# Patient Record
Sex: Male | Born: 1950 | Race: Black or African American | Hispanic: No | Marital: Married | Smoking: Never smoker
Health system: Southern US, Community
[De-identification: ages and names within clinical notes are randomized; demographics above are authoritative.]

## PROBLEM LIST (undated history)

## (undated) DIAGNOSIS — M2550 Pain in unspecified joint: Secondary | ICD-10-CM

## (undated) DIAGNOSIS — I10 Essential (primary) hypertension: Secondary | ICD-10-CM

## (undated) DIAGNOSIS — C61 Malignant neoplasm of prostate: Secondary | ICD-10-CM

## (undated) DIAGNOSIS — Z9889 Other specified postprocedural states: Secondary | ICD-10-CM

## (undated) DIAGNOSIS — K649 Unspecified hemorrhoids: Secondary | ICD-10-CM

## (undated) DIAGNOSIS — K219 Gastro-esophageal reflux disease without esophagitis: Secondary | ICD-10-CM

## (undated) DIAGNOSIS — D369 Benign neoplasm, unspecified site: Secondary | ICD-10-CM

## (undated) DIAGNOSIS — E7800 Pure hypercholesterolemia, unspecified: Secondary | ICD-10-CM

## (undated) DIAGNOSIS — K625 Hemorrhage of anus and rectum: Secondary | ICD-10-CM

## (undated) DIAGNOSIS — E079 Disorder of thyroid, unspecified: Secondary | ICD-10-CM

## (undated) DIAGNOSIS — R7989 Other specified abnormal findings of blood chemistry: Secondary | ICD-10-CM

## (undated) HISTORY — PX: COLONOSCOPY: SHX174

## (undated) HISTORY — DX: Malignant neoplasm of prostate: C61

## (undated) HISTORY — PX: PROSTATE BIOPSY: SHX241

## (undated) HISTORY — DX: Benign neoplasm, unspecified site: D36.9

---
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
## 1989-09-02 HISTORY — PX: VASECTOMY: SHX75

## 2003-09-29 ENCOUNTER — Ambulatory Visit (HOSPITAL_COMMUNITY): Admission: RE | Admit: 2003-09-29 | Discharge: 2003-09-29 | Payer: Self-pay | Admitting: General Surgery

## 2012-10-21 NOTE — H&P (Signed)
  NTS SOAP Note  Vital Signs:  Vitals as of: 10/20/2012: Systolic 135: Diastolic 90: Heart Rate 73: Temp 98.26F: Height 61ft 8in: Weight 189Lbs 0 Ounces: BMI 29  BMI : 28.74 kg/m2  Subjective: This 62 Years 45 Months old Male presents for a colonoscopy.  Had bright red blood per rectum several weeks ago after a hard bowel movement.  No gi bleed since that time.  Had a TCS many years ago.     Review of Symptoms:  Constitutional:unremarkable      headache Eyes:unremarkable   Nose/Mouth/Throat:unremarkable Cardiovascular:  unremarkable   Respiratory:unremarkable   Gastrointestin    heartburn Genitourinary:unremarkable       joint pain Skin:unremarkable Hematolgic/Lymphatic:unremarkable     Allergic/Immunologic:unremarkable     Past Medical History:    Reviewed   Past Medical History  Surgical History: hernia repair Medical Problems:  High Blood pressure, High cholesterol Allergies: nkda Medications: amlodipine, atorvastatin, omeprazole   Social History:Reviewed  Social History  Preferred Language: English Race:  Black or African American Age: 62 Years 11 Months Marital Status:  S Alcohol:  No Recreational drug(s):  No   Smoking Status: Never smoker reviewed on 10/21/2012 Functional Status reviewed on mm/dd/yyyy ------------------------------------------------ Bathing: Normal Cooking: Normal Dressing: Normal Driving: Normal Eating: Normal Managing Meds: Normal Oral Care: Normal Shopping: Normal Toileting: Normal Transferring: Normal Walking: Normal Cognitive Status reviewed on mm/dd/yyyy ------------------------------------------------ Attention: Normal Decision Making: Normal Language: Normal Memory: Normal Motor: Normal Perception: Normal Problem Solving: Normal Visual and Spatial: Normal   Family History:  Reviewed   Family History  Is there a family history of:No family h/o colon cancer  Father:  Cancer    Objective Information: General:  Well appearing, well nourished in no distress. Neck:  Supple without lymphadenopathy.  Heart:  RRR, no murmur or gallop.  Normal S1, S2.  No S3, S4.  Lungs:    CTA bilaterally, no wheezes, rhonchi, rales.  Breathing unlabored. Abdomen:Soft, NT/ND, no HSM, no masses.   deferred to procedure  Assessment:hematochezia, need for screening TCS  Diagnosis &amp; Procedure:    Plan:Scheduled for TCS on 10/27/12.   Patient Education:Alternative treatments to surgery were discussed with patient (and family).  Risks and benefits  of procedure were fully explained to the patient (and family) who gave informed consent. Patient/family questions were addressed.  Follow-up:Pending Surgery

## 2012-10-26 MED ORDER — SODIUM CHLORIDE 0.9 % IV SOLN: INTRAVENOUS | Status: DC

## 2012-10-27 ENCOUNTER — Encounter (HOSPITAL_COMMUNITY): Payer: Self-pay | Admitting: *Deleted

## 2012-10-27 ENCOUNTER — Encounter (HOSPITAL_COMMUNITY)
Admission: RE | Disposition: A | Discharge status: Home / Self Care | Payer: Self-pay | Source: Ambulatory Visit | Attending: General Surgery

## 2012-10-27 ENCOUNTER — Ambulatory Visit (HOSPITAL_COMMUNITY)
Admission: RE | Admit: 2012-10-27 | Discharge: 2012-10-27 | Disposition: A | Discharge status: Home / Self Care | Payer: 59 | Source: Ambulatory Visit | Attending: General Surgery | Admitting: General Surgery

## 2012-10-27 DIAGNOSIS — Z1211 Encounter for screening for malignant neoplasm of colon: Secondary | ICD-10-CM | POA: Insufficient documentation

## 2012-10-27 DIAGNOSIS — I10 Essential (primary) hypertension: Secondary | ICD-10-CM | POA: Insufficient documentation

## 2012-10-27 DIAGNOSIS — E7800 Pure hypercholesterolemia, unspecified: Secondary | ICD-10-CM | POA: Insufficient documentation

## 2012-10-27 DIAGNOSIS — K648 Other hemorrhoids: Secondary | ICD-10-CM | POA: Insufficient documentation

## 2012-10-27 HISTORY — DX: Hemorrhage of anus and rectum: K62.5

## 2012-10-27 HISTORY — DX: Pure hypercholesterolemia, unspecified: E78.00

## 2012-10-27 HISTORY — DX: Unspecified hemorrhoids: K64.9

## 2012-10-27 HISTORY — DX: Other specified postprocedural states: Z98.890

## 2012-10-27 HISTORY — PX: COLONOSCOPY: SHX5424

## 2012-10-27 HISTORY — DX: Essential (primary) hypertension: I10

## 2012-10-27 HISTORY — DX: Gastro-esophageal reflux disease without esophagitis: K21.9

## 2012-10-27 SURGERY — COLONOSCOPY
Anesthesia: Moderate Sedation

## 2012-10-27 MED ORDER — MEPERIDINE HCL 100 MG/ML IJ SOLN
INTRAMUSCULAR | Status: AC
  Filled 2012-10-27: qty 2

## 2012-10-27 MED ORDER — MEPERIDINE HCL 25 MG/ML IJ SOLN
INTRAMUSCULAR | Status: DC | PRN
  Administered 2012-10-27: 50 mg via INTRAVENOUS

## 2012-10-27 MED ORDER — MIDAZOLAM HCL 5 MG/5ML IJ SOLN
INTRAMUSCULAR | Status: DC | PRN
  Administered 2012-10-27: 4 mg via INTRAVENOUS
  Administered 2012-10-27: 1 mg via INTRAVENOUS

## 2012-10-27 MED ORDER — MIDAZOLAM HCL 5 MG/5ML IJ SOLN
INTRAMUSCULAR | Status: AC
  Filled 2012-10-27: qty 10

## 2012-10-27 MED ORDER — SODIUM CHLORIDE 0.45 % IV SOLN
INTRAVENOUS | Status: DC
  Administered 2012-10-27: 08:00:00 via INTRAVENOUS

## 2012-10-27 MED ORDER — STERILE WATER FOR IRRIGATION IR SOLN
Status: DC | PRN
  Administered 2012-10-27: 09:00:00

## 2012-10-27 NOTE — Op Note (Addendum)
[facility] [address] [place], [address]   COLONOSCOPY PROCEDURE REPORT  PATIENT: Joshua Hodges, Joshua Hodges  MR#: [id number] BIRTHDATE: 22-Jan-1951 , 62  yrs. old GENDER: Male ENDOSCOPIST: Franky Macho, MD REFERRED [id number], Brett Canales PROCEDURE DATE:  10/27/2012 PROCEDURE:   Colonoscopy, screening ASA CLASS:   Class II INDICATIONS:Average risk patient for colon cancer. MEDICATIONS: Versed 5 mg IV and Demerol 50 mg IV  DESCRIPTION OF PROCEDURE:   After the risks benefits and alternatives of the procedure were thoroughly explained, informed consent was obtained.  A digital rectal exam revealed no abnormalities of the rectum.   The Pentax Colonoscope [id number] endoscope was introduced through the anus and advanced to the cecum, which was identified by both the appendix and ileocecal valve. No adverse events experienced.   The quality of the prep was adequate, using MoviPrep  The instrument was then slowly withdrawn as the colon was fully examined.      COLON FINDINGS: A normal appearing cecum, ileocecal valve, and appendiceal orifice were identified.  The ascending, hepatic flexure, transverse, splenic flexure, descending, sigmoid colon and rectum appeared unremarkable.  No polyps or cancers were seen. Retroflexed views revealed internal hemorrhoids. The time to cecum=4 minutes 0 seconds.  Withdrawal time=6 minutes 0 seconds. The scope was withdrawn and the procedure completed. COMPLICATIONS: There were no complications.  ENDOSCOPIC IMPRESSION: 1.   Normal colon 2.   Internal hemorrhoids  RECOMMENDATIONS: Repeat Colonscopy in 10 years.   eSigned:  Franky Macho, MD 10/27/2012 9:05 AM   cc:

## 2012-10-27 NOTE — Interval H&P Note (Signed)
History and Physical Interval Note:  10/27/2012 8:33 AM  Joshua Hodges  has presented today for surgery, with the diagnosis of screening  The various methods of treatment have been discussed with the patient and family. After consideration of risks, benefits and other options for treatment, the patient has consented to  Procedure(s): COLONOSCOPY (N/A) as a surgical intervention .  The patient's history has been reviewed, patient examined, no change in status, stable for surgery.  I have reviewed the patient's chart and labs.  Questions were answered to the patient's satisfaction.     Franky Macho A

## 2012-10-29 ENCOUNTER — Encounter (HOSPITAL_COMMUNITY): Payer: Self-pay | Admitting: General Surgery

## 2015-02-07 ENCOUNTER — Other Ambulatory Visit: Payer: Self-pay | Admitting: Urology

## 2015-02-07 DIAGNOSIS — R972 Elevated prostate specific antigen [PSA]: Secondary | ICD-10-CM

## 2015-03-12 ENCOUNTER — Other Ambulatory Visit: Payer: Self-pay | Admitting: Urology

## 2015-03-12 DIAGNOSIS — R972 Elevated prostate specific antigen [PSA]: Secondary | ICD-10-CM

## 2015-03-14 ENCOUNTER — Ambulatory Visit (HOSPITAL_COMMUNITY)
Admission: RE | Admit: 2015-03-14 | Discharge: 2015-03-14 | Disposition: A | Discharge status: Home / Self Care | Payer: 59 | Source: Ambulatory Visit | Attending: Urology | Admitting: Urology

## 2015-03-14 ENCOUNTER — Encounter (HOSPITAL_COMMUNITY): Payer: Self-pay

## 2015-03-14 DIAGNOSIS — R972 Elevated prostate specific antigen [PSA]: Secondary | ICD-10-CM

## 2015-03-14 DIAGNOSIS — C61 Malignant neoplasm of prostate: Secondary | ICD-10-CM | POA: Diagnosis not present

## 2015-03-14 MED ORDER — LIDOCAINE HCL (PF) 2 % IJ SOLN
INTRAMUSCULAR | Status: AC
  Administered 2015-03-14: 10 mL
  Filled 2015-03-14: qty 10

## 2015-03-14 MED ORDER — CEFTRIAXONE SODIUM 1 G IJ SOLR: 1.0000 g | Freq: Once | INTRAMUSCULAR | Status: DC

## 2015-03-14 MED ORDER — GENTAMICIN SULFATE 40 MG/ML IJ SOLN
INTRAMUSCULAR | Status: AC
Start: 2015-03-14 — End: 2015-03-14
  Administered 2015-03-14: 160 mg via INTRAMUSCULAR
  Filled 2015-03-14: qty 4

## 2015-03-14 MED ORDER — GENTAMICIN SULFATE 40 MG/ML IJ SOLN
160.0000 mg | Freq: Once | INTRAMUSCULAR | Status: AC
  Administered 2015-03-14: 160 mg via INTRAMUSCULAR

## 2015-03-14 NOTE — Discharge Instructions (Signed)
Transrectal Ultrasound-Guided Biopsy °A transrectal ultrasound-guided biopsy is a procedure to remove samples of tissue from your prostate using ultrasound images to guide the procedure. The procedure is usually done to evaluate the prostate gland of men who have an elevated prostate-specific antigen (PSA). PSA is a blood test to screen for prostate cancer. The biopsy samples are taken to check for prostate cancer.  °LET YOUR HEALTH CARE PROVIDER KNOW ABOUT: °· Any allergies you have. °· All medicines you are taking, including vitamins, herbs, eye drops, creams, and over-the-counter medicines. °· Previous problems you or members of your family have had with the use of anesthetics. °· Any blood disorders you have. °· Previous surgeries you have had. °· Medical conditions you have. °RISKS AND COMPLICATIONS °Generally, this is a safe procedure. However, as with any procedure, problems can occur. Possible problems include: °· Infection of your prostate. °· Bleeding from your rectum or blood in your urine. °· Difficulty urinating. °· Nerve damage (this is usually temporary). °· Damage to surrounding structures such as blood vessels, organs, and muscles, which would require other procedures. °BEFORE THE PROCEDURE °· Do not eat or drink anything after midnight on the night before the procedure or as directed by your health care provider. °· Take medicines only as directed by your health care provider. °· Your health care provider may have you stop taking certain medicines 5-7 days before the procedure. °· You will be given an enema before the procedure. During an enema, a liquid is injected into your rectum to clear out waste. °· You may have lab tests the day of your procedure.   °· Plan to have someone take you home after the procedure. °PROCEDURE  °· You will be given medicine to help you relax (sedative) before the procedure. An IV tube will be inserted into one of your veins and used to give fluids and  medicine. °· You will be given antibiotic medicine to reduce the risk of an infection. °· You will be placed on your side for the procedure. °· A probe with lubricated gel will be placed into your rectum, and images will be taken of your prostate and surrounding structures. °· Numbing medicine will be injected into the prostate before the biopsy samples are taken. °· A biopsy needle will then be inserted and guided to your prostate with the use of the ultrasound images. °· Samples of prostate tissue will be taken, and the needle will then be removed. °· The biopsy samples will be sent to a lab to be analyzed. Results are usually back in 2-3 days. °AFTER THE PROCEDURE °· You will be taken to a recovery area where you will be monitored. °· You may have some discomfort in the rectal area. You will be given pain medicines to control this. °· You may be allowed to go home the same day, or you may need to stay in the hospital overnight. °Document Released: 01/03/2014 Document Reviewed: 04/07/2013 °ExitCare® Patient Information ©2015 [employer]. This information is not intended to replace advice given to you by your health care provider. Make sure you discuss any questions you have with your health care provider. ° °

## 2015-04-04 ENCOUNTER — Other Ambulatory Visit: Payer: Self-pay | Admitting: Urology

## 2015-04-04 DIAGNOSIS — C61 Malignant neoplasm of prostate: Secondary | ICD-10-CM

## 2015-04-07 ENCOUNTER — Ambulatory Visit (HOSPITAL_COMMUNITY)
Admission: RE | Admit: 2015-04-07 | Discharge: 2015-04-07 | Disposition: A | Discharge status: Home / Self Care | Payer: 59 | Source: Ambulatory Visit | Attending: Urology | Admitting: Urology

## 2015-04-07 DIAGNOSIS — I708 Atherosclerosis of other arteries: Secondary | ICD-10-CM | POA: Insufficient documentation

## 2015-04-07 DIAGNOSIS — R972 Elevated prostate specific antigen [PSA]: Secondary | ICD-10-CM | POA: Diagnosis not present

## 2015-04-07 DIAGNOSIS — R934 Abnormal findings on diagnostic imaging of urinary organs: Secondary | ICD-10-CM | POA: Insufficient documentation

## 2015-04-07 DIAGNOSIS — C61 Malignant neoplasm of prostate: Secondary | ICD-10-CM | POA: Insufficient documentation

## 2015-04-07 LAB — POCT I-STAT CREATININE: Creatinine, Ser: 1.1 mg/dL (ref 0.61–1.24)

## 2015-04-07 MED ORDER — IOHEXOL 300 MG/ML  SOLN
100.0000 mL | Freq: Once | INTRAMUSCULAR | Status: AC | PRN
  Administered 2015-04-07: 100 mL via INTRAVENOUS

## 2015-05-02 ENCOUNTER — Institutional Professional Consult (permissible substitution) (INDEPENDENT_AMBULATORY_CARE_PROVIDER_SITE_OTHER): Payer: 59 | Admitting: Urology

## 2015-05-02 DIAGNOSIS — C61 Malignant neoplasm of prostate: Secondary | ICD-10-CM

## 2015-05-03 ENCOUNTER — Encounter: Payer: Self-pay | Admitting: Medical Oncology

## 2015-05-03 ENCOUNTER — Telehealth: Payer: Self-pay | Admitting: Medical Oncology

## 2015-05-03 NOTE — Telephone Encounter (Signed)
Oncology Nurse Navigator Documentation  Oncology Nurse Navigator Flowsheets 05/03/2015  Referral date to RadOnc/MedOnc 05/03/2015  Navigator Encounter Type Introductory phone call-I called pt to introduce myself as the Prostate Nurse Navigator and the Coordinator of the Prostate [place].  1. I confirmed with the patient he is aware of his referral to the clinic 05/12/2015 arriving at 7:30am.  2. I discussed the format of the clinic and the physicians he will be seeing that day.  3. I discussed where the clinic is located and how to contact me.  4. I confirmed his address and informed him I would be mailing a packet of information and forms to be completed. I asked him to bring them with him the day of his appointment.   He voiced understanding of the above. I asked him to call me if he has any questions or concerns regarding his appointments or the forms he needs to complete.  Time Spent with Patient 15

## 2015-05-03 NOTE — Telephone Encounter (Signed)
I spoke with Joshua Hodges at [facility] to request Prostate pathology slides 03/14/15  be sent to Dr. Orene Desanctis at [facility].

## 2015-05-05 ENCOUNTER — Encounter: Payer: Self-pay | Admitting: Adult Health

## 2015-05-05 DIAGNOSIS — C61 Malignant neoplasm of prostate: Secondary | ICD-10-CM | POA: Insufficient documentation

## 2015-05-09 ENCOUNTER — Other Ambulatory Visit: Payer: Self-pay | Admitting: Urology

## 2015-05-09 DIAGNOSIS — C61 Malignant neoplasm of prostate: Secondary | ICD-10-CM

## 2015-05-11 ENCOUNTER — Encounter (HOSPITAL_COMMUNITY): Payer: 59

## 2015-05-11 ENCOUNTER — Encounter: Payer: Self-pay | Admitting: Radiation Oncology

## 2015-05-11 ENCOUNTER — Telehealth: Payer: Self-pay | Admitting: Medical Oncology

## 2015-05-11 NOTE — Telephone Encounter (Signed)
Oncology Nurse Navigator Documentation  Oncology Nurse Navigator Flowsheets 05/03/2015 05/03/2015 05/11/2015  Referral date to RadOnc/MedOnc 05/03/2015 - -  Navigator Encounter Type Introductory phone call Telephone Telephone- Joshua Hodges called stating he was to have his bone scan done today. He went to [facility] and when he got there he was informed that he could not get his scan due to an insurance issue. He would like to still come to the clinic in the morning unless this will be a big issue. I informed him he can still come and he can get his bone scan as scheduled. He will arrive at 7:30 am and bring his completed medical forms.  Interventions - Coordination of Care -  Time Spent with Patient 15 - 15

## 2015-05-11 NOTE — Progress Notes (Signed)
GU Location of Tumor / Histology: prostatic adenocarcinoma  If Prostate Cancer, Gleason Score is (4 + 4) and PSA is (6.06) on 02/03/15  Georgia Duff was referred by Dr. Leslie Andrea in June 2016 to Dr. Diona Fanti for evaluation of an elevated PSA.  Biopsies of prostate (if applicable) revealed:    Past/Anticipated interventions by urology, if any: prostate biopsy and referral to [facility]  Past/Anticipated interventions by medical oncology, if any: to be seen by Dr. Alen Blew in [facility]   Weight changes, if any: no  Bowel/Bladder complaints, if any: patient denies any significant urinary symptomatology   Nausea/Vomiting, if any: no  Pain issues, if any:  no  SAFETY ISSUES:  Prior radiation? no  Pacemaker/ICD? no  Possible current pregnancy? no  Is the patient on methotrexate? no  Current Complaints / other details:  64 year old male. Married with 1 son and 3 daughters. Retired. PROSTATE VOLUME 48 CC. FAIRLY LOW VOLUME BUT, HIGH GRADE DISEASE.

## 2015-05-12 ENCOUNTER — Encounter: Payer: Self-pay | Admitting: Radiation Oncology

## 2015-05-12 ENCOUNTER — Other Ambulatory Visit: Payer: Self-pay | Admitting: Urology

## 2015-05-12 ENCOUNTER — Ambulatory Visit
Admission: RE | Admit: 2015-05-12 | Discharge: 2015-05-12 | Disposition: A | Discharge status: Home / Self Care | Payer: 59 | Source: Ambulatory Visit | Attending: Radiation Oncology | Admitting: Radiation Oncology

## 2015-05-12 ENCOUNTER — Ambulatory Visit (HOSPITAL_BASED_OUTPATIENT_CLINIC_OR_DEPARTMENT_OTHER): Payer: 59 | Admitting: Oncology

## 2015-05-12 ENCOUNTER — Encounter: Payer: Self-pay | Admitting: General Practice

## 2015-05-12 ENCOUNTER — Encounter: Payer: Self-pay | Admitting: Medical Oncology

## 2015-05-12 ENCOUNTER — Encounter: Payer: Self-pay | Admitting: Adult Health

## 2015-05-12 VITALS — BP 129/87 | HR 71 | Temp 98.0°F | Resp 16 | Ht 68.0 in | Wt 182.2 lb

## 2015-05-12 DIAGNOSIS — C61 Malignant neoplasm of prostate: Secondary | ICD-10-CM | POA: Diagnosis present

## 2015-05-12 HISTORY — DX: Disorder of thyroid, unspecified: E07.9

## 2015-05-12 HISTORY — DX: Other specified abnormal findings of blood chemistry: R79.89

## 2015-05-12 NOTE — Progress Notes (Signed)
   Survivorship Program  Joshua Hodges is a very pleasant 64 y.o. 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    He presents today with his wife to the [facility]) for treatment consideration and recommendations from the urologist/surgeon, radiation oncologist, and medical oncologist.    I briefly met with Joshua Hodges and his wife during his [place] visit today. We discussed the purpose of the Survivorship Clinic, which will include monitoring for recurrence, coordinating completion of age and gender-appropriate cancer screenings, promotion of overall wellness, as well as managing potential late/long-term side effects of anti-cancer treatments.     The treatment plan for Joshua Hodges will likely include surgery, as long as his staging workup is negative. He will have staging evaluation with bone scan.  As of today, the intent of treatment for Joshua Hodges is cure/local control, therefore he will be eligible for the Survivorship Clinic upon his completion of treatment.  His survivorship care plan (SCP) will be reviewed with him during an in-person visit with myself once he has completed treatment.    Joshua Hodges was encouraged to ask questions and all questions were answered to his satisfaction.  He was given my business card and encouraged to contact me with any concerns regarding survivorship.  I look forward to participating in his care.    Mike Craze, NP [place] [phone]

## 2015-05-12 NOTE — Progress Notes (Signed)
                               Care Plan Summary  Name: Joshua Hodges DOB: [birth year]   Your Medical Team:   Urologist -  Dr. Raynelle Bring, [facility]  Radiation Oncologist - Dr. Tyler Pita, [facility]   Medical Oncologist - Dr. Zola Button, [place]  Recommendations: 1) Bone Scan 2) Surgery  3) Radiation  * These recommendations are based on information available as of today's consult.      Recommendations may change depending on the results of further tests or exams.  Next Steps: 1) Bone Scan  2) Dr. Lynne Logan office will schedule surgery date   When appointments need to be scheduled, you will be contacted by [facility] and/or [facility].  Questions?  Please do not hesitate to call Cira Rue, RN, BSN, CRNI at [phone] any questions or concerns.  Shirlean Mylar is your Oncology Nurse Navigator and is available to assist you while you're receiving your medical care at [facility].

## 2015-05-12 NOTE — Progress Notes (Signed)
Spiritual Care Note  Met Mr Riendeau and his wife in [facility].  Per family, they have had loss and major life transitions in recent years.  In 11-27-12 their son, age 62, died suddenly of a brain lesion they were not aware of.  They report support from their church and pastor.  Per family, they are also key supporters for their two daughters; Mr Wingert drives the family's newest baby to childcare each morning.  One daughter is in the process of moving home from [place], which involves several logistical challenges. Pt reports distress at not being able to help family in his usual ways due to tx.  Provided pastoral presence, reflective listening, normalization of feelings, witness to their story, and bereavement support as they continue to grieve and heal from the death of their son.  Described Support Center team/resources in detail, providing print materials, and encouraged them to reach out any time for support from chaplain, counseling intern, or LCSW.  Family verbalized appreciation and is aware of ongoing chaplain availability, but please also page as needs arise.  Thank you.  [place], [place] Pager [phone] Voicemail  [phone]

## 2015-05-12 NOTE — Progress Notes (Addendum)
Here for second opinion. Been with [facility] Hillis Range, MD and Talmage Nap, MD. Retired [employer]. Married to [employer]. Four children, Miranda, [place] and [place]. Stanton Kidney is deceased. Reports fatigue and right shoulder pain. Reports heartburn. Reports hematuria only following prostate biopsy.

## 2015-05-12 NOTE — Consult Note (Signed)
Reason for Referral: Prostate cancer.   HPI: 64 year old gentleman native of [place] where he lived the majority of his life. His early gentleman with history of hypertension and hyperlipidemia and was found to have an elevated PSA on a physical exam of 4.5. A repeat PSA was up to 6.06 and was referred to Dr. Diona Fanti at [facility]. A biopsy obtained on 03/14/2015 showed a Gleason score of 4+4 = 8 in one core and Gleason score 4+3 = 7 in another core. He had 2 other cores of Gleason score 3+3 = 6. He is completely asymptomatic at this time and was referred to the prostate cancer multidisciplinary clinic for discussion. He does not report any urinary symptoms at this time. He does not report any nocturia or dysuria. He is satisfied with these urinary stream. He had a CT scan that was unremarkable for any metastasis. Bone scan has not been completed at this time.  He does not report any headaches, blurry vision or syncope or seizures. Does not report any fevers or chills or sweats. Does not report any cough or hemoptysis or hematemesis. Does not report any nausea or vomiting or abdominal pain. Does not report any skeletal complaints. Remaining review of systems unremarkable.   Past Medical History  Diagnosis Date  . Hypertension   . Hypercholesteremia   . GERD (gastroesophageal reflux disease)   . Hemorrhoids   . Rectal bleeding   . H/O colonoscopy   . Prostate cancer   . Thyroid disease   . Low testosterone   :  Past Surgical History  Procedure Laterality Date  . Hernia repair      at age 86  . Colonoscopy    . Colonoscopy N/A 10/27/2012    Procedure: COLONOSCOPY;  Surgeon: Jamesetta So, MD;  Location: [facility];  Service: Gastroenterology;  Laterality: N/A;  . Vasectomy    . Prostate biopsy    :   Current outpatient prescriptions:  .  amLODipine (NORVASC) 10 MG tablet, , Disp: , Rfl:  .  atorvastatin (LIPITOR) 20 MG tablet, Take by mouth., Disp: , Rfl:  .   Cholecalciferol (VITAMIN D3) 5000 UNITS TABS, Take by mouth., Disp: , Rfl:  .  clonazePAM (KLONOPIN) 1 MG tablet, Take by mouth., Disp: , Rfl:  .  LORazepam (ATIVAN) 0.5 MG tablet, Take by mouth., Disp: , Rfl: :  No Known Allergies:  Family History  Problem Relation Age of Onset  . Colon cancer Neg Hx   . Multiple myeloma Father   . Cancer Paternal Uncle     kidney cancer  :  Social History   Social History  . Marital Status: Married    Spouse Name: N/A  . Number of Children: N/A  . Years of Education: N/A   Occupational History  . Not on file.   Social History Main Topics  . Smoking status: Never Smoker   . Smokeless tobacco: Never Used  . Alcohol Use: No  . Drug Use: No  . Sexual Activity: Yes   Other Topics Concern  . Not on file   Social History Narrative  :  Pertinent items are noted in HPI.  Exam: ECOG 0 General appearance: alert and cooperative Head: Normocephalic, without obvious abnormality Throat: lips, mucosa, and tongue normal; teeth and gums normal Neck: no adenopathy Back: negative Resp: clear to auscultation bilaterally Cardio: regular rate and rhythm, S1, S2 normal, no murmur, click, rub or gallop GI: soft, non-tender; bowel sounds normal; no masses,  no organomegaly Extremities: extremities normal, atraumatic, no cyanosis or edema Pulses: 2+ and symmetric Skin: Skin color, texture, turgor normal. No rashes or lesions  Assessment and Plan:   64 year old gentleman with prostate cancer diagnosed in July 2016. He presented with a PSA of 6.06 and a Gleason score of 4+4 = 8 in 1 core with total of 4 out of 12 cores involved. He has an IPSS score of 9 and otherwise completely asymptomatic.   His case was discussed today in the prostate cancer multidisciplinary clinic. His CT scan was reviewed with radiology. His specimen was also discussed with the reviewing pathologist.  Options of treatment were reviewed today including surgical resection versus  hormonal therapy with androgen depravation. The duration of hormone therapy with radiation would probably be close to 2 years at this time given his high-risk disease. But before definitive local therapy he will definitely require bone scan for staging purposes. If his bone scan is negative, then both local therapies are appropriate at this time. He is in favor of surgical therapy at this time which would be a reasonable option.  If his bone scan is positive for metastatic disease, then systemic therapy would be related ago with hormone therapy alone and possible chemotherapy if he has large bulk of disease. I think this is less likely and he'll be an excellent candidate for local therapy with a negative bone scan.

## 2015-05-12 NOTE — Progress Notes (Signed)
Radiation Oncology         [phone] ________________________________  Multidisciplinary Prostate Cancer Clinic  Initial Radiation Oncology Consultation  Name: Joshua Hodges MRN: [id number]  Date: 05/12/2015  DOB: 1951-07-20  FE:[id number],HVFMBBU D, MD  Raynelle Bring, MD   REFERRING PHYSICIAN: Raynelle Bring, MD  DIAGNOSIS: 64 y.o. gentleman with stage T1c adenocarcinoma of the prostate with a Gleason's score of 4+4 and a PSA of 6.06    ICD-9-CM ICD-10-CM   1. Prostate cancer Joshua Hodges ILLNESS::[name] is a 64 y.o. gentleman.  He was noted to have an elevated PSA of 7.5 in March 2016 by his primary care physician, Dr. Leslie Andrea.  Accordingly, he was referred for evaluation in urology by Dr. Diona Fanti on 02/03/15,  digital rectal examination was performed at that time revealing a 40 g prostate with no nodules. Repeat PSA at this time was 6.06. The patient proceeded to transrectal ultrasound with 12 biopsies of the prostate on 03/14/15.  The prostate volume measured 48 cc.  Out of 12 core biopsies, 4 were positive.  The maximum Gleason score was 4+4, and this was seen in left lateral apex. Other biopsies were lower grade and in the apex.  The patient reviewed the biopsy results with his urologist and he has kindly been referred today to the multidisciplinary prostate cancer clinic for presentation of pathology and radiology studies in our conference for discussion of potential radiation treatment options and clinical evaluation.   PREVIOUS RADIATION THERAPY: No  PAST MEDICAL HISTORY:  has a past medical history of Hypertension; Hypercholesteremia; GERD (gastroesophageal reflux disease); Hemorrhoids; Rectal bleeding; H/O colonoscopy; Prostate cancer; Thyroid disease; and Low testosterone.    PAST SURGICAL HISTORY: Past Surgical History  Procedure Laterality Date  . Hernia repair      at age 25  . Colonoscopy    . Colonoscopy N/A 10/27/2012   Procedure: COLONOSCOPY;  Surgeon: Jamesetta So, MD;  Location: [facility];  Service: Gastroenterology;  Laterality: N/A;  . Vasectomy    . Prostate biopsy      FAMILY HISTORY: family history includes Cancer in his paternal uncle; Multiple myeloma in his father. There is no history of Colon cancer.  SOCIAL HISTORY:  reports that he has never smoked. He has never used smokeless tobacco. He reports that he does not drink alcohol or use illicit drugs.  ALLERGIES: Review of patient's allergies indicates no known allergies.  MEDICATIONS:  Current Outpatient Prescriptions  Medication Sig Dispense Refill  . amLODipine (NORVASC) 10 MG tablet     . atorvastatin (LIPITOR) 20 MG tablet Take by mouth.    . Cholecalciferol (VITAMIN D3) 5000 UNITS TABS Take by mouth.    . clonazePAM (KLONOPIN) 1 MG tablet Take by mouth.    Marland Kitchen LORazepam (ATIVAN) 0.5 MG tablet Take by mouth.     No current facility-administered medications for this encounter.    REVIEW OF SYSTEMS:  A 15 point review of systems is documented in the electronic medical record. This was obtained by the nursing staff. However, I reviewed this with the patient to discuss relevant findings and make appropriate changes.  A comprehensive review of systems was negative..  The patient completed an IPSS and IIEF questionnaire.  His IPSS score was 5 indicating mild urinary outflow obstructive symptoms.  He indicated that his erectile function is likely able to complete sexual activity on most attempts.   PHYSICAL EXAM: This patient is in no acute distress.  He is alert and oriented.   height is _0  (1.727 m) and weight is 182 lb 3.2 oz (82.645 kg). His oral temperature is 98 F (36.7 C). His blood pressure is 129/87 and his pulse is 71. His respiration is 16 and oxygen saturation is 99%.  He exhibits no respiratory distress or labored breathing.  He appears neurologically intact.  His mood is pleasant.  His affect is appropriate.  Please note the  digital rectal exam findings described above.  KPS = 100  100 - Normal; no complaints; no evidence of disease. 90   - Able to carry on normal activity; minor signs or symptoms of disease. 80   - Normal activity with effort; some signs or symptoms of disease. 2   - Cares for self; unable to carry on normal activity or to do active work. 60   - Requires occasional assistance, but is able to care for most of his personal needs. 50   - Requires considerable assistance and frequent medical care. 85   - Disabled; requires special care and assistance. 35   - Severely disabled; hospital admission is indicated although death not imminent. 20   - Very sick; hospital admission necessary; active supportive treatment necessary. 10   - Moribund; fatal processes progressing rapidly. 0     - Dead  Karnofsky DA, Abelmann WH, Craver LS and Burchenal JH [phone]) The use of the nitrogen mustards in the palliative treatment of carcinoma: with particular reference to bronchogenic carcinoma Cancer 1 [id number]   LABORATORY DATA:  No results found for: WBC, HGB, HCT, MCV, PLT No results found for: NA, K, CL, CO2 No results found for: ALT, AST, GGT, ALKPHOS, BILITOT   RADIOGRAPHY: No results found.    IMPRESSION: This gentleman is a pleasant 64 y.o. gentleman with stage T1c adenocarcinoma of the prostate with a Gleason's score of 4+4 and a PSA of 6.06.  His T-Stage, Gleason's Score, and PSA put him into the high risk group.  Accordingly he is eligible for a variety of potential treatment options including surgery or radiation therapy with androgen deprivaiton. Marland Kitchen  PLAN: Today I reviewed the findings and workup thus far.  We discussed the natural history of prostate cancer.  We reviewed the the implications of T-stage, Gleason's Score, and PSA on decision-making and outcomes related to prostate cancer.  We discussed radiation treatment in the management of prostate cancer with regard to the logistics and delivery of  external beam radiation treatment as well as the logistics and delivery of prostate brachytherapy.  We compared and contrasted each of these approaches and also compared these against prostatectomy.    The patient focused most of his questions and interest in robotic-assisted laparoscopic radical prostatectomy.  We discussed some of the potential advantages of surgery including surgical staging, the availability of salvage radiotherapy to the prostatic fossa, and the confidence associated with immediate biochemical response.  We discussed some of the potential proven indications for postoperative radiotherapy including positive margins, extracapsular extension, and seminal vesicle involvement. We also talked about some of the other potential findings leading to a recommendation for radiotherapy including a non-zero postoperative PSA and positive lymph nodes.   The patient is currently leaning towards RA-LRP surgery. His bone scan is pending insurance coverage.   I spent 40 minutes face to face with the patient and more than 50% of that time was spent in counseling and/or coordination of care.   This document serves as a record of services personally performed  by Tyler Pita, MD. It was created on his behalf by Arlyce Harman, a trained medical scribe. The creation of this record is based on the scribe's personal observations and the provider's statements to them. This document has been checked and approved by the attending provider.    ------------------------------------------------  Sheral Apley Tammi Klippel, M.D.

## 2015-05-12 NOTE — Progress Notes (Signed)
Please see consult note.  

## 2015-05-12 NOTE — Consult Note (Signed)
Chief Complaint  High risk prostate cancer   Reason For Visit  Reason for consult: To discuss treatment options for high risk prostate cancer. Physician requesting consult: Dr. Franchot Gallo PCP: Dr. Leslie Andrea Location of consultation: [facility]   History of Present Illness    Joshua Hodges is a 64 year old gentleman who was noted to have an elevated PSA prompting a urologic evaluation.  This was repeated by Dr. Diona Fanti and remained elevated at 6.06.  He therefore underwent a prostate needle biopsy on 03/14/15 which confirmed Gleason 4+4=8 adenocarcinoma of the prostate with 4 out of 12 biopsy cores positive for malignancy.  His high grade disease was noted at the left apex which correlated with a hypoechoic area noted on ultrasound. CT imaging of the abdomen and pelvis was performed on 04/07/15 and demonstrated no pelvic lymphadenopathy or evidence of metastatic prostate cancer.  However, it did show a 1 cm right lower pole renal lesion with questionable enhancement raising concern about a possible primary renal malignancy, left renal cyst, and an area of colonic luminal narrowing at the hepatic flexure which was non-specific.  His last colonoscopy was in 2014 by Dr. Buford Dresser and was normal.  He has a paternal uncle with a history of prostate cancer.  He also had a first cousin who died of metastatic prostate cancer.  He had been scheduled for a bone scan although apparently had been denied by his insurance company.   Joshua Hodges is very well informed about his treatment options having seen Dr. Diona Fanti and also having had a consultation at [facility].  ** His PMH includes hypertension and hypercholesterolemia.  TNM stage: cT1c N0 Mx PSA: 6.06 Biopsy (03/14/15): 4/12 cores positive    Left: L lateral apex (50%, 4+4=8), L apex (60%), 4+3=7)    Right: R apex (5%, 3+3=6), R lateral apex (5%, 3+3=6) Prostate volume: 48  cc  Nomogram OC disease: 34% EPE: 63% SVI: 9% LNI: 11% PFS (surgery): 56% at 5 years, 40% at 10 years  Urinary function: He has minimal lower urinary tract symptoms.  IPSS is 5. Erectile function: He was unable to complete his SHIM questionnaire.  He has wife had not been sexually active for the past 5 years.  He is unable to give me an accurate assessment of his current erectile function.  However, he does state that he would like to take precautions to preserve erectile function if appropriate from an oncologic standpoint.   Past Medical History  1. History of Anxiety (F41.9)  2. History of esophageal reflux (Z87.19)  3. History of hypercholesterolemia (Z86.39)  4. History of hypertension (Z86.79)  5. History of hyperthyroidism (Z86.39)  Surgical History  1. History of Hernia Repair  2. History of Surgery Of Male Genitalia Vasectomy   He underwent an open right inguinal hernia repair in the 42s.   Current Meds  1. AmLODIPine Besylate 5 MG Oral Tablet;  Therapy: (Recorded:03Jun2016) to Recorded  2. Aspirin 81 MG TABS;  Therapy: (IRSWNIOE:70JJK0938) to Recorded  3. Atorvastatin Calcium 20 MG Oral Tablet;  Therapy: (HWEXHBZJ:69CVE9381) to Recorded  4. Benadryl TABS; prn allergies;  Therapy: (OFBPZWCH:85IDP8242) to Recorded  5. ClonazePAM 1 MG Oral Tablet;  Therapy: (PNTIRWER:15QMG8676) to Recorded  6. LORazepam 0.5 MG Oral Tablet;  Therapy: (PPJKDTOI:71IWP8099) to Recorded  7. Vitamin D3 5000 UNIT Oral Capsule; TAKE 1 CAPSULE EVERY OTHER DAY;  Therapy: (IPJASNKN:39JQB3419) to Recorded  Allergies  1. No Known Drug Allergies  Family  History  1. Family history of Death of family member : Father  2. Family history of kidney cancer (Z80.51) : Paternal Uncle  3. Family history of kidney stones (Z84.1) : Mother  4. Family history of multiple myeloma (Z80.7) : Father   He has a paternal uncle and apparently also a first cousin who have a history of prostate cancer stated  above.   Social History   Denied: History of Alcohol use   Married   Never a smoker   Retired  Review of Systems AU Complete-Male: Genitourinary, constitutional, skin, eye, otolaryngeal, hematologic/lymphatic, cardiovascular, pulmonary, endocrine, musculoskeletal, gastrointestinal, neurological and psychiatric system(s) were reviewed and pertinent findings if present are noted and are otherwise negative.    Physical Exam Constitutional: Well nourished and well developed . No acute distress.  ENT:. The ears and nose are normal in appearance.  Neck: The appearance of the neck is normal and no neck mass is present.  Pulmonary: No respiratory distress, normal respiratory rhythm and effort and clear bilateral breath sounds.  Cardiovascular: Heart rate and rhythm are normal . No peripheral edema.  Abdomen: right inguinal incision site(s) well healed. The abdomen is soft and nontender. No masses are palpated. No CVA tenderness. No hernias are palpable. No hepatosplenomegaly noted.  Rectal: Rectal exam demonstrates normal sphincter tone, no tenderness and no masses. Prostate size is estimated to be 45 g. There is no definite palpable abnormality other than may be a very slight amount of induration towards the left apex. The prostate has no nodularity and is not tender. The left seminal vesicle is nonpalpable. The right seminal vesicle is nonpalpable. The perineum is normal on inspection.  Lymphatics: The femoral and inguinal nodes are not enlarged or tender.  Skin: Normal skin turgor, no visible rash and no visible skin lesions.  Neuro/Psych:. Mood and affect are appropriate.    Results/Data  We have reviewed his medical records, PSA results, pathology report, and independently reviewed his pathology slides and CT scan in conference today.  Findings are as dictated above.     Assessment  1. Adenocarcinoma of prostate (C61)  2. Renal mass (N28.89)  Discussion/Summary  1.  High-risk  prostate cancer: I did recommend that Joshua Hodges proceed with bone scan imaging considering his high risk disease.  It is unclear to me why his insurance company has inappropriately denied this test considering he clearly has high risk, Gleason 8 prostate cancer.  This would be a guideline recommended test for staging his high risk cancer.  I told him that I will plan to look into this further and we will plan to proceed with reordering this test if necessary.  Much of our discussion today was under the assumption that his bone scan would be negative for metastatic disease.  We did discuss options for management of high-risk prostate cancer.  He has seen Dr. Tammi Klippel earlier this morning and discussed in detail the option of external beam radiation therapy in conjunction with long-term androgen deprivation therapy.   The patient was counseled about the natural history of prostate cancer and the standard treatment options that are available for prostate cancer. It was explained to him how his age and life expectancy, clinical stage, Gleason score, and PSA affect his prognosis, the decision to proceed with additional staging studies, as well as how that information influences recommended treatment strategies. We discussed the roles for active surveillance, radiation therapy, surgical therapy, androgen deprivation, as well as ablative therapy options for the treatment of prostate cancer  as appropriate to his individual cancer situation. We discussed the risks and benefits of these options with regard to their impact on cancer control and also in terms of potential adverse events, complications, and impact on quiality of life particularly related to urinary, bowel, and sexual function. The patient was encouraged to ask questions throughout the discussion today and all questions were answered to his stated satisfaction. In addition, the patient was provided with and/or directed to appropriate resources and  literature for further education about prostate cancer and treatment options.   We discussed surgical therapy for prostate cancer including the different available surgical approaches. We discussed, in detail, the risks and expectations of surgery with regard to cancer control, urinary control, and erectile function as well as the expected postoperative recovery process. Additional risks of surgery including but not limited to bleeding, infection, hernia formation, nerve damage, lymphocele formation, bowel/rectal injury potentially necessitating colostomy, damage to the urinary tract resulting in urine leakage, urethral stricture, and the cardiopulmonary risks such as myocardial infarction, stroke, death, venothromboembolism, etc. were explained. The risk of open surgical conversion for robotic/laparoscopic prostatectomy was also discussed.   He understands that he is an appropriate candidate for either primary radiotherapy with androgen deprivation or primary surgical therapy with possible need for adjuvant therapy.  He informs me that he would like to avoid androgen deprivation therapy if possible and is most interested in proceeding with primary surgical treatment.  He does wish to proceed with treatment here in [place] and will tentatively be scheduled for a right nerve sparing and partial left nerve sparing robot-assisted laparoscopic radical prostatectomy and bilateral pelvic lymphadenectomy.  2.  Right renal mass: We discussed his mass which does raise some potential suspicion for malignancy.  We discussed proceeding with follow-up MR imaging following his prostate cancer surgery.  3.  Narrowing of the ascending/transverse colon: We reviewed his CT scan with radiology today.  This finding is nonspecific and not very concerning.  He did have a negative colonoscopy in 2014 in considering this fact, radiology thinks that this is not a major concern.  I will plan to send a letter and a copy of the CT  scan report to his gastroenterologist for further evaluation.  Cc: Dr. Zola Button Dr. Tyler Pita Dr. Franchot Gallo Dr. Leslie Andrea  A total of 55 minutes were spent in the overall care of the patient today with 45 minutes in direct face to face consultation.    Signatures Electronically signed by : Raynelle Bring, M.D.; May 12 2015 10:59AM EST

## 2015-05-16 ENCOUNTER — Encounter: Payer: Self-pay | Admitting: Internal Medicine

## 2015-05-17 ENCOUNTER — Encounter (HOSPITAL_COMMUNITY)
Admission: RE | Admit: 2015-05-17 | Discharge: 2015-05-17 | Disposition: A | Discharge status: Home / Self Care | Payer: 59 | Source: Ambulatory Visit | Attending: Urology | Admitting: Urology

## 2015-05-17 ENCOUNTER — Telehealth: Payer: Self-pay | Admitting: Gastroenterology

## 2015-05-17 ENCOUNTER — Encounter (HOSPITAL_COMMUNITY): Payer: Self-pay

## 2015-05-17 DIAGNOSIS — C61 Malignant neoplasm of prostate: Secondary | ICD-10-CM

## 2015-05-17 MED ORDER — TECHNETIUM TC 99M MEDRONATE IV KIT
25.0000 | PACK | Freq: Once | INTRAVENOUS | Status: AC | PRN
  Administered 2015-05-17: 25 via INTRAVENOUS

## 2015-05-17 NOTE — Telephone Encounter (Signed)
Received correspondence records from [facility] regarding patient. Abnormal CT scan of the colon, not an official referral form.  Patient's previous colonoscopies by Dr. Aviva Signs, last one in 2014. We have never seen the patient before in the past.  Please contact [facility] and find out if they want [place] to take care of the patient (if so we need a referral), otherwise they need to get this information back to Dr. Aviva Signs regarding abnormal colon on CT scan.  See paper correspondence.

## 2015-05-23 NOTE — Telephone Encounter (Signed)
Pt has OV already scheduled for 9/27 at 8 with [facility].

## 2015-05-23 NOTE — Patient Instructions (Addendum)
Joshua Hodges  05/23/2015   Your procedure is scheduled on: Thursday September 29th, 2016  Report to [facility] Main  Entrance take [place]  elevators to 3rd floor to  [place] at 1000 AM.  Call this number if you have problems the morning of surgery [phone]   Remember: ONLY 1 PERSON MAY GO WITH YOU TO SHORT STAY TO GET  READY MORNING OF [place].  Do not eat food :After Midnight Tuesday night, clear liquids all day Wednesday September 28th, 2016 per Dr. Alinda Money  FOLLOW ALL BOWEL PREP INSTRUCTIONS FROM DR Alinda Money   Take these medicines the morning of surgery with A SIP OF WATER: Clonazepam if needed, Atorvastatin  Amlodipine                               You may not have any metal on your body including hair pins and              piercings  Do not wear jewelry, make-up, lotions, powders or perfumes, deodorant             Do not wear nail polish.  Do not shave  48 hours prior to surgery.              Men may shave face and neck.   Do not bring valuables to the hospital. [name].  Contacts, dentures or bridgework may not be worn into surgery.  Leave suitcase in the car. After surgery it may be brought to your room.     Patients discharged the day of surgery will not be allowed to drive home.  Name and phone number of your driver:  Special Instructions: N/A              Please read over the following fact sheets you were given: _____________________________________________________________________             [facility] - Preparing for Surgery Before surgery, you can play an important role.  Because skin is not sterile, your skin needs to be as free of germs as possible.  You can reduce the number of germs on your skin by washing with CHG (chlorahexidine gluconate) soap before surgery.  CHG is an antiseptic cleaner which kills germs and bonds with the skin to continue killing germs even after  washing. Please DO NOT use if you have an allergy to CHG or antibacterial soaps.  If your skin becomes reddened/irritated stop using the CHG and inform your nurse when you arrive at Short Stay. Do not shave (including legs and underarms) for at least 48 hours prior to the first CHG shower.  You may shave your face/neck. Please follow these instructions carefully:  1.  Shower with CHG Soap the night before surgery and the  morning of Surgery.  2.  If you choose to wash your hair, wash your hair first as usual with your  normal  shampoo.  3.  After you shampoo, rinse your hair and body thoroughly to remove the  shampoo.                           4.  Use CHG as you would any other liquid soap.  You  can apply chg directly  to the skin and wash                       Gently with a scrungie or clean washcloth.  5.  Apply the CHG Soap to your body ONLY FROM THE NECK DOWN.   Do not use on face/ open                           Wound or open sores. Avoid contact with eyes, ears mouth and genitals (private parts).                       Wash face,  Genitals (private parts) with your normal soap.             6.  Wash thoroughly, paying special attention to the area where your surgery  will be performed.  7.  Thoroughly rinse your body with warm water from the neck down.  8.  DO NOT shower/wash with your normal soap after using and rinsing off  the CHG Soap.                9.  Pat yourself dry with a clean towel.            10.  Wear clean pajamas.            11.  Place clean sheets on your bed the night of your first shower and do not  sleep with pets. Day of Surgery : Do not apply any lotions/deodorants the morning of surgery.  Please wear clean clothes to the hospital/surgery center.  FAILURE TO FOLLOW THESE INSTRUCTIONS MAY RESULT IN THE CANCELLATION OF YOUR SURGERY PATIENT SIGNATURE_________________________________  NURSE  SIGNATURE__________________________________  ________________________________________________________________________   Joshua Hodges  An incentive spirometer is a tool that can help keep your lungs clear and active. This tool measures how well you are filling your lungs with each breath. Taking long deep breaths may help reverse or decrease the chance of developing breathing (pulmonary) problems (especially infection) following:  A long period of time when you are unable to move or be active. BEFORE THE PROCEDURE   If the spirometer includes an indicator to show your best effort, your nurse or respiratory therapist will set it to a desired goal.  If possible, sit up straight or lean slightly forward. Try not to slouch.  Hold the incentive spirometer in an upright position. INSTRUCTIONS FOR USE   Sit on the edge of your bed if possible, or sit up as far as you can in bed or on a chair.  Hold the incentive spirometer in an upright position.  Breathe out normally.  Place the mouthpiece in your mouth and seal your lips tightly around it.  Breathe in slowly and as deeply as possible, raising the piston or the ball toward the top of the column.  Hold your breath for 3-5 seconds or for as long as possible. Allow the piston or ball to fall to the bottom of the column.  Remove the mouthpiece from your mouth and breathe out normally.  Rest for a few seconds and repeat Steps 1 through 7 at least 10 times every 1-2 hours when you are awake. Take your time and take a few normal breaths between deep breaths.  The spirometer may include an indicator to show your best effort. Use the indicator as a goal to work toward during  each repetition.  After each set of 10 deep breaths, practice coughing to be sure your lungs are clear. If you have an incision (the cut made at the time of surgery), support your incision when coughing by placing a pillow or rolled up towels firmly against it. Once  you are able to get out of bed, walk around indoors and cough well. You may stop using the incentive spirometer when instructed by your caregiver.  RISKS AND COMPLICATIONS  Take your time so you do not get dizzy or light-headed.  If you are in pain, you may need to take or ask for pain medication before doing incentive spirometry. It is harder to take a deep breath if you are having pain. AFTER USE  Rest and breathe slowly and easily.  It can be helpful to keep track of a log of your progress. Your caregiver can provide you with a simple table to help with this. If you are using the spirometer at home, follow these instructions: [place] IF:   You are having difficultly using the spirometer.  You have trouble using the spirometer as often as instructed.  Your pain medication is not giving enough relief while using the spirometer.  You develop fever of 100.5 F (38.1 C) or higher. SEEK IMMEDIATE MEDICAL CARE IF:   You cough up bloody sputum that had not been present before.  You develop fever of 102 F (38.9 C) or greater.  You develop worsening pain at or near the incision site. MAKE SURE YOU:   Understand these instructions.  Will watch your condition.  Will get help right away if you are not doing well or get worse. Document Released: 12/30/2006 Document Revised: 11/11/2011 Document Reviewed: 03/02/2007 ExitCare Patient Information 2014 ExitCare, [place].   ________________________________________________________________________  WHAT IS A BLOOD TRANSFUSION? Blood Transfusion Information  A transfusion is the replacement of blood or some of its parts. Blood is made up of multiple cells which provide different functions.  Red blood cells carry oxygen and are used for blood loss replacement.  White blood cells fight against infection.  Platelets control bleeding.  Plasma helps clot blood.  Other blood products are available for specialized needs, such as  hemophilia or other clotting disorders. BEFORE THE TRANSFUSION  Who gives blood for transfusions?   Healthy volunteers who are fully evaluated to make sure their blood is safe. This is blood bank blood. Transfusion therapy is the safest it has ever been in the practice of medicine. Before blood is taken from a donor, a complete history is taken to make sure that person has no history of diseases nor engages in risky social behavior (examples are intravenous drug use or sexual activity with multiple partners). The donor's travel history is screened to minimize risk of transmitting infections, such as malaria. The donated blood is tested for signs of infectious diseases, such as HIV and hepatitis. The blood is then tested to be sure it is compatible with you in order to minimize the chance of a transfusion reaction. If you or a relative donates blood, this is often done in anticipation of surgery and is not appropriate for emergency situations. It takes many days to process the donated blood. RISKS AND COMPLICATIONS Although transfusion therapy is very safe and saves many lives, the main dangers of transfusion include:   Getting an infectious disease.  Developing a transfusion reaction. This is an allergic reaction to something in the blood you were given. Every precaution is taken to prevent  this. The decision to have a blood transfusion has been considered carefully by your caregiver before blood is given. Blood is not given unless the benefits outweigh the risks. AFTER THE TRANSFUSION  Right after receiving a blood transfusion, you will usually feel much better and more energetic. This is especially true if your red blood cells have gotten low (anemic). The transfusion raises the level of the red blood cells which carry oxygen, and this usually causes an energy increase.  The nurse administering the transfusion will monitor you carefully for complications. HOME CARE INSTRUCTIONS  No special  instructions are needed after a transfusion. You may find your energy is better. Speak with your caregiver about any limitations on activity for underlying diseases you may have. SEEK MEDICAL CARE IF:   Your condition is not improving after your transfusion.  You develop redness or irritation at the intravenous (IV) site. SEEK IMMEDIATE MEDICAL CARE IF:  Any of the following symptoms occur over the next 12 hours:  Shaking chills.  You have a temperature by mouth above 102 F (38.9 C), not controlled by medicine.  Chest, back, or muscle pain.  People around you feel you are not acting correctly or are confused.  Shortness of breath or difficulty breathing.  Dizziness and fainting.  You get a rash or develop hives.  You have a decrease in urine output.  Your urine turns a dark color or changes to pink, red, or brown. Any of the following symptoms occur over the next 10 days:  You have a temperature by mouth above 102 F (38.9 C), not controlled by medicine.  Shortness of breath.  Weakness after normal activity.  The white part of the eye turns yellow (jaundice).  You have a decrease in the amount of urine or are urinating less often.  Your urine turns a dark color or changes to pink, red, or brown. Document Released: 08/16/2000 Document Revised: 11/11/2011 Document Reviewed: 04/04/2008 ExitCare Patient Information 2014 ExitCare, [place].  _______________________________________________________________________   CLEAR LIQUID DIET   Foods Allowed                                                                     Foods Excluded  Coffee and tea, regular and decaf                             liquids that you cannot  Plain Jell-O in any flavor                                             see through such as: Fruit ices (not with fruit pulp)                                     milk, soups, orange juice  Iced Popsicles                                    All solid  food Carbonated beverages, regular and diet                                    Cranberry, grape and apple juices Sports drinks like Gatorade Lightly seasoned clear broth or consume(fat free) Sugar, honey syrup  Sample Menu Breakfast                                Lunch                                     Supper Cranberry juice                    Beef broth                            Chicken broth Jell-O                                     Grape juice                           Apple juice Coffee or tea                        Jell-O                                      Popsicle                                                Coffee or tea                        Coffee or tea  _____________________________________________________________________

## 2015-05-25 ENCOUNTER — Encounter (HOSPITAL_COMMUNITY): Payer: Self-pay

## 2015-05-25 ENCOUNTER — Ambulatory Visit (HOSPITAL_COMMUNITY)
Admission: RE | Admit: 2015-05-25 | Discharge: 2015-05-25 | Disposition: A | Discharge status: Home / Self Care | Payer: 59 | Source: Ambulatory Visit | Attending: Urology | Admitting: Urology

## 2015-05-25 ENCOUNTER — Encounter (HOSPITAL_COMMUNITY)
Admission: RE | Admit: 2015-05-25 | Discharge: 2015-05-25 | Disposition: A | Discharge status: Home / Self Care | Payer: 59 | Source: Ambulatory Visit | Attending: Urology | Admitting: Urology

## 2015-05-25 DIAGNOSIS — C61 Malignant neoplasm of prostate: Secondary | ICD-10-CM | POA: Insufficient documentation

## 2015-05-25 DIAGNOSIS — Z01818 Encounter for other preprocedural examination: Secondary | ICD-10-CM | POA: Insufficient documentation

## 2015-05-25 DIAGNOSIS — Z0183 Encounter for blood typing: Secondary | ICD-10-CM | POA: Diagnosis not present

## 2015-05-25 DIAGNOSIS — Z01812 Encounter for preprocedural laboratory examination: Secondary | ICD-10-CM | POA: Diagnosis not present

## 2015-05-25 HISTORY — DX: Pain in unspecified joint: M25.50

## 2015-05-25 LAB — CBC
HCT: 46.3 % (ref 39.0–52.0)
Hemoglobin: 15.2 g/dL (ref 13.0–17.0)
MCH: 29.1 pg (ref 26.0–34.0)
MCHC: 32.8 g/dL (ref 30.0–36.0)
MCV: 88.7 fL (ref 78.0–100.0)
Platelets: 205 10*3/uL (ref 150–400)
RBC: 5.22 MIL/uL (ref 4.22–5.81)
RDW: 13.4 % (ref 11.5–15.5)
WBC: 4.6 10*3/uL (ref 4.0–10.5)

## 2015-05-25 LAB — BASIC METABOLIC PANEL
Anion gap: 5 (ref 5–15)
BUN: 12 mg/dL (ref 6–20)
CO2: 30 mmol/L (ref 22–32)
Calcium: 9.5 mg/dL (ref 8.9–10.3)
Chloride: 103 mmol/L (ref 101–111)
Creatinine, Ser: 1.07 mg/dL (ref 0.61–1.24)
GFR calc Af Amer: >60 mL/min (ref >60)
GFR calc non Af Amer: >60 mL/min (ref >60)
Glucose, Bld: 101 mg/dL — ABNORMAL HIGH (ref 65–99)
Potassium: 3.8 mmol/L (ref 3.5–5.1)
Sodium: 138 mmol/L (ref 135–145)

## 2015-05-25 LAB — ABO/RH: ABO/RH(D): A POS

## 2015-05-25 NOTE — Progress Notes (Signed)
   05/25/15 0939  OBSTRUCTIVE SLEEP APNEA  Have you ever been diagnosed with sleep apnea through a sleep study? No  Do you snore loudly (loud enough to be heard through closed doors)?  1  Do you often feel tired, fatigued, or sleepy during the daytime (such as falling asleep during driving or talking to someone)? 0  Has anyone observed you stop breathing during your sleep? 1  Do you have, or are you being treated for high blood pressure? 1  BMI more than 35 kg/m2? 0  Age > 50 (1-yes) 1  Neck circumference greater than:Male 16 inches or larger, Male 17inches or larger? 0  Male Gender (Yes=1) 1  Obstructive Sleep Apnea Score 5  Score 5 or greater  Results sent to PCP

## 2015-05-30 ENCOUNTER — Encounter: Payer: Self-pay | Admitting: Nurse Practitioner

## 2015-05-30 ENCOUNTER — Ambulatory Visit (INDEPENDENT_AMBULATORY_CARE_PROVIDER_SITE_OTHER): Payer: 59 | Admitting: Nurse Practitioner

## 2015-05-30 VITALS — BP 136/87 | HR 84 | Temp 97.0°F | Ht 68.0 in | Wt 183.2 lb

## 2015-05-30 DIAGNOSIS — R935 Abnormal findings on diagnostic imaging of other abdominal regions, including retroperitoneum: Secondary | ICD-10-CM | POA: Diagnosis not present

## 2015-05-30 DIAGNOSIS — K5900 Constipation, unspecified: Secondary | ICD-10-CM | POA: Insufficient documentation

## 2015-05-30 DIAGNOSIS — K219 Gastro-esophageal reflux disease without esophagitis: Secondary | ICD-10-CM | POA: Insufficient documentation

## 2015-05-30 NOTE — Patient Instructions (Signed)
1. We will contact your urological surgeon and ask for anticipated timeframe appropriate for colonoscopy. 2. Make sure you're having adequate water and fiber intake. He can start taking a daily fiber supplement to help promote good bowel movements. 3. Return for follow-up in 2 months we can plan for an schedule your colonoscopy.

## 2015-05-30 NOTE — Progress Notes (Signed)
CC'D TO PCP °

## 2015-05-30 NOTE — Assessment & Plan Note (Signed)
Patient with occasional bouts of constipation approximately 1-2 year. Likely due to decrease in fiber and water intake. Recommend he increase his water intake, eat more fibrous foods. Advised she can start an over-the-counter fiber supplement as needed.

## 2015-05-30 NOTE — Progress Notes (Addendum)
Primary Care Physician:  Robert Bellow, MD Primary Gastroenterologist:  Dr. Gala Romney  Chief Complaint  Patient presents with  . abnormal colon on CT    HPI:   64 year old male presents on referral from urology specialists.Patient has been diagnosed high-risk prostate cancer scheduled to undergo a robotic surgical therapy soon. CT abdomen and pelvis done during workup for staging found focal area of luminal narrowing and mural thickening of the hepatic flexure of the colon and per radiologist recommendation, although this may be simply related to under distention of the colon, correlation with colonoscopy is recommended to fully evaluate. Last colonoscopy completed to 20 01/19/2013 with Dr. Arnoldo Morale from surgery which found normal colon and internal hemorrhoids with a repeat recommended in 10 years. Labs recently done for surgical prescreening include CBC and BMP both of which were normal.  Today he denies persistent abdominal pain, some discomfort attributed to prostate CA. Denies N/V. Has GERD history, was on Nexium and Prilosec but stopped due to reading an article that long term PPI use can cause kidney problems. Currently not on PPI, takes [employer] about twice a week. He only has symptoms if he eats "the wrong foods" or eats too late. A couple months where he was having constipation which lasted about 2-3 months. Stools are back to normal now. Denies hematochezia and melena. Only blood is scant toilet tissue hematochezia rarely and when constipated. Denies any recent change in bowel habits other then as noted above. Denies chest pain, dyspnea, dizziness, lightheadedness, syncope, near syncope. Denies any other upper or lower GI symptoms.  Past Medical History  Diagnosis Date  . Hypertension   . Hypercholesteremia   . GERD (gastroesophageal reflux disease)   . Hemorrhoids   . Rectal bleeding   . H/O colonoscopy   . Thyroid disease none in last 9 months    for 3 months, then took  off medication  . Low testosterone   . Joint pain   . Prostate cancer     Past Surgical History  Procedure Laterality Date  . Colonoscopy    . Colonoscopy N/A 10/27/2012    Procedure: COLONOSCOPY;  Surgeon: Jamesetta So, MD;  Location: [facility];  Service: Gastroenterology;  Laterality: N/A;  . Vasectomy  1991  . Prostate biopsy    . Hernia repair  1960    at age 83    Current Outpatient Prescriptions  Medication Sig Dispense Refill  . amLODipine (NORVASC) 10 MG tablet Take 10 mg by mouth daily.     Marland Kitchen atorvastatin (LIPITOR) 20 MG tablet Take 20 mg by mouth every morning.     . Calcium Carbonate-Simethicone (ALKA-SELTZER HEARTBURN + GAS) 750-80 MG CHEW Chew 1 tablet by mouth daily as needed (heartburn).    . clonazePAM (KLONOPIN) 1 MG tablet Take 1 mg by mouth 2 (two) times daily as needed for anxiety.     Marland Kitchen LORazepam (ATIVAN) 0.5 MG tablet Take 0.5 mg by mouth at bedtime as needed for sleep. 1/2 to 1 tab    . omeprazole (PRILOSEC OTC) 20 MG tablet Take 20 mg by mouth daily as needed (hearburn).    . Cholecalciferol (VITAMIN D3) 5000 UNITS TABS Take 5,000 Units by mouth every other day.      No current facility-administered medications for this visit.    Allergies as of 05/30/2015  . (No Known Allergies)    Family History  Problem Relation Age of Onset  . Colon cancer Neg Hx   . Multiple  myeloma Father   . Cancer Paternal Uncle     kidney cancer    Social History   Social History  . Marital Status: Married    Spouse Name: N/A  . Number of Children: N/A  . Years of Education: N/A   Occupational History  . Not on file.   Social History Main Topics  . Smoking status: Never Smoker   . Smokeless tobacco: Never Used  . Alcohol Use: No     Comment: none since 1985  . Drug Use: Yes    Special: Marijuana     Comment: none since 1985  . Sexual Activity: Yes   Other Topics Concern  . Not on file   Social History Narrative    Review of Systems: General:  Negative for anorexia, weight loss, fever, chills, fatigue, weakness. Eyes: Negative for vision changes.  ENT: Negative for hoarseness, difficulty swallowing. CV: Negative for chest pain, angina, palpitations, dyspnea on exertion, peripheral edema.  Respiratory: Negative for dyspnea at rest, dyspnea on exertion, cough, sputum, wheezing.  GI: See history of present illness. Derm: Negative for rash or itching.  Psych: Admits some anxiety related to pending surgery, but feels confident things will go well.  Endo: Negative for unusual weight change.  Heme: Negative for bruising or bleeding. Allergy: Negative for rash or hives.    Physical Exam: BP 136/87 mmHg  Pulse 84  Temp(Src) 97 F (36.1 C)  Ht 5' 8"  (1.727 m)  Wt 183 lb 3.2 oz (83.099 kg)  BMI 27.86 kg/m2 General:   Alert and oriented. Pleasant and cooperative. Well-nourished and well-developed.  Head:  Normocephalic and atraumatic. Eyes:  Without icterus, sclera clear and conjunctiva pink.  Ears:  Normal auditory acuity. Cardiovascular:  S1, S2 present without murmurs appreciated. Normal pulses noted. Extremities without clubbing or edema. Respiratory:  Clear to auscultation bilaterally. No wheezes, rales, or rhonchi. No distress.  Gastrointestinal:  +BS, soft, non-tender and non-distended. No HSM noted. No guarding or rebound. No masses appreciated.  Rectal:  Deferred  Neurologic:  Alert and oriented x4;  grossly normal neurologically. Psych:  Alert and cooperative. Normal mood and affect.    05/30/2015 8:26 AM

## 2015-05-30 NOTE — Assessment & Plan Note (Addendum)
CT abdomen and pelvis during surgical workup found focal area of luminal narrowing and mural thickening of the hepatic flexure of the colon with radiologist recommendation for colonoscopy to fully evaluate. It is possible this is just due to decompression of the colon. However, given his pending surgery for prostate cancer, this should be evaluated by colonoscopy. He is generally asymptomatic from a GI standpoint. Recent colonoscopy in 2014 was normal with no polyps, tumors, or cancers. We will recheck out to urology for their opinion on when he would be healed adequately for proceeding with a colonoscopy status post robotic surgical resection of the prostate. We will bring him back in 2 months to evaluate and schedule his procedure.

## 2015-05-30 NOTE — Assessment & Plan Note (Signed)
History of GERD symptoms which she has been on over-the-counter Prilosec for. However he stopped taking this due to reading a story that long-term PPI use can damage her kidneys. We discussed extensively the role of research and the use of medications when needed. His kidney function is currently stable. He is wanting to hold off on PPI at this time as his symptoms only occur when he eats something he shouldn't or eats too late. We'll try Tums or other type medication. Return for follow-up in 2 months.

## 2015-05-30 NOTE — Progress Notes (Signed)
Pt will need to wait 8 weeks after surgery before he can have the TCS.

## 2015-05-31 NOTE — H&P (Signed)
Chief Complaint  High risk prostate cancer   Reason For Visit  Reason for consult: To discuss treatment options for high risk prostate cancer. Physician requesting consult: Dr. Stephen Dahlstedt PCP: Dr. Stephen Knowlton Location of consultation: [facility]   History of Present Illness    Mr. Ballman is a 64 year old gentleman who was noted to have an elevated PSA prompting a urologic evaluation.  This was repeated by Dr. Dahlstedt and remained elevated at 6.06.  He therefore underwent a prostate needle biopsy on 03/14/15 which confirmed Gleason 4+4=8 adenocarcinoma of the prostate with 4 out of 12 biopsy cores positive for malignancy.  His high grade disease was noted at the left apex which correlated with a hypoechoic area noted on ultrasound. CT imaging of the abdomen and pelvis was performed on 04/07/15 and demonstrated no pelvic lymphadenopathy or evidence of metastatic prostate cancer.  However, it did show a 1 cm right lower pole renal lesion with questionable enhancement raising concern about a possible primary renal malignancy, left renal cyst, and an area of colonic luminal narrowing at the hepatic flexure which was non-specific.  His last colonoscopy was in 2014 by Dr. Roark and was normal.  He has a paternal uncle with a history of prostate cancer.  He also had a first cousin who died of metastatic prostate cancer.  He had been scheduled for a bone scan although apparently had been denied by his insurance company.   Mr. Ruggirello is very well informed about his treatment options having seen Dr. Dahlstedt and also having had a consultation at [facility].  ** His PMH includes hypertension and hypercholesterolemia.  TNM stage: cT1c N0 Mx PSA: 6.06 Biopsy (03/14/15): 4/12 cores positive    Left: L lateral apex (50%, 4+4=8), L apex (60%), 4+3=7)    Right: R apex (5%, 3+3=6), R lateral apex (5%, 3+3=6) Prostate volume: 48  cc  Nomogram OC disease: 34% EPE: 63% SVI: 9% LNI: 11% PFS (surgery): 56% at 5 years, 40% at 10 years  Urinary function: He has minimal lower urinary tract symptoms.  IPSS is 5. Erectile function: He was unable to complete his SHIM questionnaire.  He has wife had not been sexually active for the past 5 years.  He is unable to give me an accurate assessment of his current erectile function.  However, he does state that he would like to take precautions to preserve erectile function if appropriate from an oncologic standpoint.   Past Medical History  1. History of Anxiety (F41.9)  2. History of esophageal reflux (Z87.19)  3. History of hypercholesterolemia (Z86.39)  4. History of hypertension (Z86.79)  5. History of hyperthyroidism (Z86.39)  Surgical History  1. History of Hernia Repair  2. History of Surgery Of Male Genitalia Vasectomy   He underwent an open right inguinal hernia repair in the 60s.   Current Meds  1. AmLODIPine Besylate 5 MG Oral Tablet;  Therapy: (Recorded:03Jun2016) to Recorded  2. Aspirin 81 MG TABS;  Therapy: (Recorded:03Jun2016) to Recorded  3. Atorvastatin Calcium 20 MG Oral Tablet;  Therapy: (Recorded:03Jun2016) to Recorded  4. Benadryl TABS; prn allergies;  Therapy: (Recorded:03Jun2016) to Recorded  5. ClonazePAM 1 MG Oral Tablet;  Therapy: (Recorded:03Jun2016) to Recorded  6. LORazepam 0.5 MG Oral Tablet;  Therapy: (Recorded:03Jun2016) to Recorded  7. Vitamin D3 5000 UNIT Oral Capsule; TAKE 1 CAPSULE EVERY OTHER DAY;  Therapy: (Recorded:03Jun2016) to Recorded  Allergies  1. No Known Drug Allergies  Family   History  1. Family history of Death of family member : Father  2. Family history of kidney cancer (Z80.51) : Paternal Uncle  3. Family history of kidney stones (Z84.1) : Mother  4. Family history of multiple myeloma (Z80.7) : Father   He has a paternal uncle and apparently also a first cousin who have a history of prostate cancer stated  above.   Social History   Denied: History of Alcohol use   Married   Never a smoker   Retired  Review of Systems AU Complete-Male: Genitourinary, constitutional, skin, eye, otolaryngeal, hematologic/lymphatic, cardiovascular, pulmonary, endocrine, musculoskeletal, gastrointestinal, neurological and psychiatric system(s) were reviewed and pertinent findings if present are noted and are otherwise negative.    Physical Exam Constitutional: Well nourished and well developed . No acute distress.  ENT:. The ears and nose are normal in appearance.  Neck: The appearance of the neck is normal and no neck mass is present.  Pulmonary: No respiratory distress, normal respiratory rhythm and effort and clear bilateral breath sounds.  Cardiovascular: Heart rate and rhythm are normal . No peripheral edema.  Abdomen: right inguinal incision site(s) well healed. The abdomen is soft and nontender. No masses are palpated. No CVA tenderness. No hernias are palpable. No hepatosplenomegaly noted.  Rectal: Rectal exam demonstrates normal sphincter tone, no tenderness and no masses. Prostate size is estimated to be 45 g. There is no definite palpable abnormality other than may be a very slight amount of induration towards the left apex. The prostate has no nodularity and is not tender. The left seminal vesicle is nonpalpable. The right seminal vesicle is nonpalpable. The perineum is normal on inspection.  Lymphatics: The femoral and inguinal nodes are not enlarged or tender.  Skin: Normal skin turgor, no visible rash and no visible skin lesions.  Neuro/Psych:. Mood and affect are appropriate.    Results/Data  We have reviewed his medical records, PSA results, pathology report, and independently reviewed his pathology slides and CT scan in conference today.  Findings are as dictated above.     Assessment  1. Adenocarcinoma of prostate (C61)  2. Renal mass (N28.89)  Discussion/Summary  1.  High-risk  prostate cancer: I did recommend that Mr. Kemppainen proceed with bone scan imaging considering his high risk disease.  It is unclear to me why his insurance company has inappropriately denied this test considering he clearly has high risk, Gleason 8 prostate cancer.  This would be a guideline recommended test for staging his high risk cancer.  I told him that I will plan to look into this further and we will plan to proceed with reordering this test if necessary.  Much of our discussion today was under the assumption that his bone scan would be negative for metastatic disease.  We did discuss options for management of high-risk prostate cancer.  He has seen Dr. Tammi Klippel earlier this morning and discussed in detail the option of external beam radiation therapy in conjunction with long-term androgen deprivation therapy.   The patient was counseled about the natural history of prostate cancer and the standard treatment options that are available for prostate cancer. It was explained to him how his age and life expectancy, clinical stage, Gleason score, and PSA affect his prognosis, the decision to proceed with additional staging studies, as well as how that information influences recommended treatment strategies. We discussed the roles for active surveillance, radiation therapy, surgical therapy, androgen deprivation, as well as ablative therapy options for the treatment of prostate cancer  as appropriate to his individual cancer situation. We discussed the risks and benefits of these options with regard to their impact on cancer control and also in terms of potential adverse events, complications, and impact on quiality of life particularly related to urinary, bowel, and sexual function. The patient was encouraged to ask questions throughout the discussion today and all questions were answered to his stated satisfaction. In addition, the patient was provided with and/or directed to appropriate resources and  literature for further education about prostate cancer and treatment options.   We discussed surgical therapy for prostate cancer including the different available surgical approaches. We discussed, in detail, the risks and expectations of surgery with regard to cancer control, urinary control, and erectile function as well as the expected postoperative recovery process. Additional risks of surgery including but not limited to bleeding, infection, hernia formation, nerve damage, lymphocele formation, bowel/rectal injury potentially necessitating colostomy, damage to the urinary tract resulting in urine leakage, urethral stricture, and the cardiopulmonary risks such as myocardial infarction, stroke, death, venothromboembolism, etc. were explained. The risk of open surgical conversion for robotic/laparoscopic prostatectomy was also discussed.   He understands that he is an appropriate candidate for either primary radiotherapy with androgen deprivation or primary surgical therapy with possible need for adjuvant therapy.  He informs me that he would like to avoid androgen deprivation therapy if possible and is most interested in proceeding with primary surgical treatment.  He does wish to proceed with treatment here in [place] and will tentatively be scheduled for a right nerve sparing and partial left nerve sparing robot-assisted laparoscopic radical prostatectomy and bilateral pelvic lymphadenectomy.  2.  Right renal mass: We discussed his mass which does raise some potential suspicion for malignancy.  We discussed proceeding with follow-up MR imaging following his prostate cancer surgery.  3.  Narrowing of the ascending/transverse colon: We reviewed his CT scan with radiology today.  This finding is nonspecific and not very concerning.  He did have a negative colonoscopy in 2014 in considering this fact, radiology thinks that this is not a major concern.  I will plan to send a letter and a copy of the CT  scan report to his gastroenterologist for further evaluation.  Cc: Dr. Zola Button Dr. Tyler Pita Dr. Franchot Gallo Dr. Leslie Andrea  A total of 55 minutes were spent in the overall care of the patient today with 45 minutes in direct face to face consultation.    Signatures Electronically signed by : Raynelle Bring, M.D.; May 12 2015 10:59AM EST

## 2015-05-31 NOTE — Progress Notes (Signed)
Spoke with patient by phone surgery time changed to 1115, arrive 915 am 06-01-15 [facility] short stay, pt aware

## 2015-06-01 ENCOUNTER — Ambulatory Visit: Payer: 59 | Admitting: Nurse Practitioner

## 2015-06-01 ENCOUNTER — Inpatient Hospital Stay (HOSPITAL_COMMUNITY): Payer: 59 | Admitting: Anesthesiology

## 2015-06-01 ENCOUNTER — Encounter (HOSPITAL_COMMUNITY)
Admission: RE | Disposition: A | Discharge status: Home / Self Care | Payer: Self-pay | Source: Ambulatory Visit | Attending: Urology

## 2015-06-01 ENCOUNTER — Encounter (HOSPITAL_COMMUNITY): Payer: Self-pay | Admitting: *Deleted

## 2015-06-01 ENCOUNTER — Inpatient Hospital Stay (HOSPITAL_COMMUNITY)
Admission: RE | Admit: 2015-06-01 | Discharge: 2015-06-02 | DRG: 708 | Disposition: A | Discharge status: Home / Self Care | Payer: 59 | Source: Ambulatory Visit | Attending: Urology | Admitting: Urology

## 2015-06-01 ENCOUNTER — Encounter: Payer: Self-pay | Admitting: Medical Oncology

## 2015-06-01 DIAGNOSIS — Z807 Family history of other malignant neoplasms of lymphoid, hematopoietic and related tissues: Secondary | ICD-10-CM | POA: Diagnosis not present

## 2015-06-01 DIAGNOSIS — Z23 Encounter for immunization: Secondary | ICD-10-CM | POA: Diagnosis not present

## 2015-06-01 DIAGNOSIS — N2889 Other specified disorders of kidney and ureter: Secondary | ICD-10-CM | POA: Diagnosis present

## 2015-06-01 DIAGNOSIS — I10 Essential (primary) hypertension: Secondary | ICD-10-CM | POA: Diagnosis present

## 2015-06-01 DIAGNOSIS — K219 Gastro-esophageal reflux disease without esophagitis: Secondary | ICD-10-CM | POA: Diagnosis present

## 2015-06-01 DIAGNOSIS — C61 Malignant neoplasm of prostate: Secondary | ICD-10-CM | POA: Diagnosis present

## 2015-06-01 DIAGNOSIS — E780 Pure hypercholesterolemia: Secondary | ICD-10-CM | POA: Diagnosis present

## 2015-06-01 DIAGNOSIS — Z7982 Long term (current) use of aspirin: Secondary | ICD-10-CM

## 2015-06-01 DIAGNOSIS — F419 Anxiety disorder, unspecified: Secondary | ICD-10-CM | POA: Diagnosis present

## 2015-06-01 DIAGNOSIS — Z8042 Family history of malignant neoplasm of prostate: Secondary | ICD-10-CM

## 2015-06-01 DIAGNOSIS — Z79899 Other long term (current) drug therapy: Secondary | ICD-10-CM

## 2015-06-01 HISTORY — PX: LYMPHADENECTOMY: SHX5960

## 2015-06-01 HISTORY — PX: ROBOT ASSISTED LAPAROSCOPIC RADICAL PROSTATECTOMY: SHX5141

## 2015-06-01 LAB — HEMOGLOBIN AND HEMATOCRIT, BLOOD
HCT: 45.1 % (ref 39.0–52.0)
Hemoglobin: 15.2 g/dL (ref 13.0–17.0)

## 2015-06-01 LAB — TYPE AND SCREEN
ABO/RH(D): A POS
Antibody Screen: NEGATIVE

## 2015-06-01 SURGERY — ROBOTIC ASSISTED LAPAROSCOPIC RADICAL PROSTATECTOMY LEVEL 2
Anesthesia: General

## 2015-06-01 MED ORDER — AMLODIPINE BESYLATE 10 MG PO TABS
10.0000 mg | ORAL_TABLET | Freq: Every day | ORAL | Status: DC
  Administered 2015-06-02: 10 mg via ORAL
  Filled 2015-06-01: qty 1

## 2015-06-01 MED ORDER — DIPHENHYDRAMINE HCL 50 MG/ML IJ SOLN: 12.5000 mg | Freq: Four times a day (QID) | INTRAMUSCULAR | Status: DC | PRN

## 2015-06-01 MED ORDER — LABETALOL HCL 5 MG/ML IV SOLN
INTRAVENOUS | Status: AC
  Filled 2015-06-01: qty 4

## 2015-06-01 MED ORDER — ONDANSETRON HCL 4 MG/2ML IJ SOLN
INTRAMUSCULAR | Status: AC
  Filled 2015-06-01: qty 2

## 2015-06-01 MED ORDER — KCL IN DEXTROSE-NACL 20-5-0.45 MEQ/L-%-% IV SOLN
INTRAVENOUS | Status: AC
  Filled 2015-06-01: qty 1000

## 2015-06-01 MED ORDER — SODIUM CHLORIDE 0.9 % IR SOLN
Status: DC | PRN
  Administered 2015-06-01: 1000 mL

## 2015-06-01 MED ORDER — PROMETHAZINE HCL 25 MG/ML IJ SOLN: 6.2500 mg | INTRAMUSCULAR | Status: DC | PRN

## 2015-06-01 MED ORDER — SODIUM CHLORIDE 0.9 % IV BOLUS (SEPSIS)
1000.0000 mL | Freq: Once | INTRAVENOUS | Status: AC
  Administered 2015-06-01: 1000 mL via INTRAVENOUS

## 2015-06-01 MED ORDER — HYDROMORPHONE HCL 1 MG/ML IJ SOLN
0.2500 mg | INTRAMUSCULAR | Status: DC | PRN
  Administered 2015-06-01 (×3): 0.5 mg via INTRAVENOUS

## 2015-06-01 MED ORDER — PANTOPRAZOLE SODIUM 40 MG PO TBEC
40.0000 mg | DELAYED_RELEASE_TABLET | Freq: Every day | ORAL | Status: DC
  Administered 2015-06-02: 40 mg via ORAL
  Filled 2015-06-01: qty 1

## 2015-06-01 MED ORDER — SUCCINYLCHOLINE CHLORIDE 20 MG/ML IJ SOLN
INTRAMUSCULAR | Status: DC | PRN
  Administered 2015-06-01: 100 mg via INTRAVENOUS

## 2015-06-01 MED ORDER — LIDOCAINE HCL (CARDIAC) 20 MG/ML IV SOLN
INTRAVENOUS | Status: AC
  Filled 2015-06-01: qty 5

## 2015-06-01 MED ORDER — LIDOCAINE HCL (CARDIAC) 20 MG/ML IV SOLN
INTRAVENOUS | Status: DC | PRN
  Administered 2015-06-01: 50 mg via INTRAVENOUS

## 2015-06-01 MED ORDER — OMEPRAZOLE MAGNESIUM 20 MG PO TBEC: 20.0000 mg | DELAYED_RELEASE_TABLET | Freq: Every day | ORAL | Status: DC | PRN

## 2015-06-01 MED ORDER — MIDAZOLAM HCL 2 MG/2ML IJ SOLN
INTRAMUSCULAR | Status: AC
  Filled 2015-06-01: qty 4

## 2015-06-01 MED ORDER — GLYCOPYRROLATE 0.2 MG/ML IJ SOLN
INTRAMUSCULAR | Status: DC | PRN
  Administered 2015-06-01: 0.6 mg via INTRAVENOUS

## 2015-06-01 MED ORDER — MORPHINE SULFATE (PF) 2 MG/ML IV SOLN: 2.0000 mg | INTRAVENOUS | Status: DC | PRN

## 2015-06-01 MED ORDER — HYDROMORPHONE HCL 1 MG/ML IJ SOLN
INTRAMUSCULAR | Status: AC
  Filled 2015-06-01: qty 1

## 2015-06-01 MED ORDER — DOCUSATE SODIUM 100 MG PO CAPS
100.0000 mg | ORAL_CAPSULE | Freq: Two times a day (BID) | ORAL | Status: DC
  Administered 2015-06-01 – 2015-06-02 (×2): 100 mg via ORAL
  Filled 2015-06-01 (×2): qty 1

## 2015-06-01 MED ORDER — STERILE WATER FOR IRRIGATION IR SOLN
Status: DC | PRN
  Administered 2015-06-01: 1000 mL

## 2015-06-01 MED ORDER — CEFAZOLIN SODIUM-DEXTROSE 2-3 GM-% IV SOLR
INTRAVENOUS | Status: AC
  Filled 2015-06-01: qty 50

## 2015-06-01 MED ORDER — KCL IN DEXTROSE-NACL 20-5-0.45 MEQ/L-%-% IV SOLN
INTRAVENOUS | Status: DC
  Administered 2015-06-01 – 2015-06-02 (×2): via INTRAVENOUS
  Filled 2015-06-01 (×4): qty 1000

## 2015-06-01 MED ORDER — DIPHENHYDRAMINE HCL 12.5 MG/5ML PO ELIX: 12.5000 mg | ORAL_SOLUTION | Freq: Four times a day (QID) | ORAL | Status: DC | PRN

## 2015-06-01 MED ORDER — FENTANYL CITRATE (PF) 250 MCG/5ML IJ SOLN
INTRAMUSCULAR | Status: AC
  Filled 2015-06-01: qty 25

## 2015-06-01 MED ORDER — HYDROMORPHONE HCL 2 MG/ML IJ SOLN
INTRAMUSCULAR | Status: AC
  Filled 2015-06-01: qty 1

## 2015-06-01 MED ORDER — NEOSTIGMINE METHYLSULFATE 10 MG/10ML IV SOLN
INTRAVENOUS | Status: AC
  Filled 2015-06-01: qty 1

## 2015-06-01 MED ORDER — KETOROLAC TROMETHAMINE 15 MG/ML IJ SOLN
15.0000 mg | Freq: Four times a day (QID) | INTRAMUSCULAR | Status: DC
  Administered 2015-06-01 – 2015-06-02 (×4): 15 mg via INTRAVENOUS
  Filled 2015-06-01 (×6): qty 1

## 2015-06-01 MED ORDER — PROPOFOL 10 MG/ML IV BOLUS
INTRAVENOUS | Status: DC | PRN
  Administered 2015-06-01: 200 mg via INTRAVENOUS

## 2015-06-01 MED ORDER — KETOROLAC TROMETHAMINE 15 MG/ML IJ SOLN
INTRAMUSCULAR | Status: AC
  Filled 2015-06-01: qty 1

## 2015-06-01 MED ORDER — CEFAZOLIN SODIUM 1-5 GM-% IV SOLN
1.0000 g | Freq: Three times a day (TID) | INTRAVENOUS | Status: AC
  Administered 2015-06-01 – 2015-06-02 (×2): 1 g via INTRAVENOUS
  Filled 2015-06-01 (×2): qty 50

## 2015-06-01 MED ORDER — GLYCOPYRROLATE 0.2 MG/ML IJ SOLN
INTRAMUSCULAR | Status: AC
  Filled 2015-06-01: qty 3

## 2015-06-01 MED ORDER — ROCURONIUM BROMIDE 100 MG/10ML IV SOLN
INTRAVENOUS | Status: AC
Start: 2015-06-01 — End: 2015-06-01
  Filled 2015-06-01: qty 1

## 2015-06-01 MED ORDER — INFLUENZA VAC SPLIT QUAD 0.5 ML IM SUSY
0.5000 mL | PREFILLED_SYRINGE | INTRAMUSCULAR | Status: AC | PRN
  Administered 2015-06-02: 0.5 mL via INTRAMUSCULAR
  Filled 2015-06-01: qty 0.5

## 2015-06-01 MED ORDER — CEFAZOLIN SODIUM-DEXTROSE 2-3 GM-% IV SOLR
2.0000 g | INTRAVENOUS | Status: AC
  Administered 2015-06-01: 2 g via INTRAVENOUS

## 2015-06-01 MED ORDER — ACETAMINOPHEN 325 MG PO TABS: 650.0000 mg | ORAL_TABLET | ORAL | Status: DC | PRN

## 2015-06-01 MED ORDER — SULFAMETHOXAZOLE-TRIMETHOPRIM 800-160 MG PO TABS: 1.0000 | ORAL_TABLET | Freq: Two times a day (BID) | ORAL | Status: DC

## 2015-06-01 MED ORDER — LACTATED RINGERS IV SOLN
INTRAVENOUS | Status: DC | PRN
  Administered 2015-06-01: 14:00:00

## 2015-06-01 MED ORDER — PROPOFOL 10 MG/ML IV BOLUS
INTRAVENOUS | Status: AC
  Filled 2015-06-01: qty 20

## 2015-06-01 MED ORDER — LABETALOL HCL 5 MG/ML IV SOLN
INTRAVENOUS | Status: DC | PRN
  Administered 2015-06-01: 5 mg via INTRAVENOUS

## 2015-06-01 MED ORDER — HYDROCODONE-ACETAMINOPHEN 5-325 MG PO TABS: 1.0000 | ORAL_TABLET | Freq: Four times a day (QID) | ORAL | Status: DC | PRN

## 2015-06-01 MED ORDER — BUPIVACAINE-EPINEPHRINE (PF) 0.25% -1:200000 IJ SOLN
INTRAMUSCULAR | Status: AC
  Filled 2015-06-01: qty 30

## 2015-06-01 MED ORDER — ROCURONIUM BROMIDE 100 MG/10ML IV SOLN
INTRAVENOUS | Status: DC | PRN
  Administered 2015-06-01: 5 mg via INTRAVENOUS
  Administered 2015-06-01: 10 mg via INTRAVENOUS
  Administered 2015-06-01: 45 mg via INTRAVENOUS

## 2015-06-01 MED ORDER — ONDANSETRON HCL 4 MG/2ML IJ SOLN
INTRAMUSCULAR | Status: DC | PRN
  Administered 2015-06-01: 4 mg via INTRAVENOUS

## 2015-06-01 MED ORDER — HYDROMORPHONE HCL 1 MG/ML IJ SOLN
INTRAMUSCULAR | Status: DC | PRN
  Administered 2015-06-01: 1 mg via INTRAVENOUS
  Administered 2015-06-01 (×2): 0.5 mg via INTRAVENOUS

## 2015-06-01 MED ORDER — FENTANYL CITRATE (PF) 100 MCG/2ML IJ SOLN
INTRAMUSCULAR | Status: DC | PRN
  Administered 2015-06-01: 100 ug via INTRAVENOUS
  Administered 2015-06-01: 50 ug via INTRAVENOUS
  Administered 2015-06-01: 100 ug via INTRAVENOUS

## 2015-06-01 MED ORDER — BUPIVACAINE-EPINEPHRINE 0.25% -1:200000 IJ SOLN
INTRAMUSCULAR | Status: DC | PRN
  Administered 2015-06-01: 30 mL

## 2015-06-01 MED ORDER — NEOSTIGMINE METHYLSULFATE 10 MG/10ML IV SOLN
INTRAVENOUS | Status: DC | PRN
  Administered 2015-06-01: 4 mg via INTRAVENOUS

## 2015-06-01 MED ORDER — MIDAZOLAM HCL 5 MG/5ML IJ SOLN
INTRAMUSCULAR | Status: DC | PRN
  Administered 2015-06-01: 2 mg via INTRAVENOUS

## 2015-06-01 MED ORDER — ATORVASTATIN CALCIUM 20 MG PO TABS
20.0000 mg | ORAL_TABLET | Freq: Every morning | ORAL | Status: DC
  Administered 2015-06-02: 20 mg via ORAL
  Filled 2015-06-01: qty 1

## 2015-06-01 MED ORDER — CLONAZEPAM 1 MG PO TABS: 1.0000 mg | ORAL_TABLET | Freq: Two times a day (BID) | ORAL | Status: DC | PRN

## 2015-06-01 MED ORDER — DEXAMETHASONE SODIUM PHOSPHATE 10 MG/ML IJ SOLN
INTRAMUSCULAR | Status: AC
  Filled 2015-06-01: qty 1

## 2015-06-01 MED ORDER — HEPARIN SODIUM (PORCINE) 1000 UNIT/ML IJ SOLN
INTRAMUSCULAR | Status: AC
  Filled 2015-06-01: qty 1

## 2015-06-01 MED ORDER — LACTATED RINGERS IV SOLN
INTRAVENOUS | Status: DC
Start: 2015-06-01 — End: 2015-06-01
  Administered 2015-06-01: 14:00:00 via INTRAVENOUS
  Administered 2015-06-01: 1000 mL via INTRAVENOUS

## 2015-06-01 SURGICAL SUPPLY — 50 items
CABLE HIGH FREQUENCY MONO STRZ (ELECTRODE) ×4 IMPLANT
CATH FOLEY 2WAY SLVR 18FR 30CC (CATHETERS) ×4 IMPLANT
CATH ROBINSON RED A/P 16FR (CATHETERS) ×4 IMPLANT
CATH ROBINSON RED A/P 8FR (CATHETERS) ×4 IMPLANT
CATH TIEMANN FOLEY 18FR 5CC (CATHETERS) ×4 IMPLANT
CHLORAPREP W/TINT 26ML (MISCELLANEOUS) ×4 IMPLANT
CLIP LIGATING HEM O LOK PURPLE (MISCELLANEOUS) ×8 IMPLANT
CLOTH BEACON ORANGE TIMEOUT ST (SAFETY) ×4 IMPLANT
COVER SURGICAL LIGHT HANDLE (MISCELLANEOUS) ×4 IMPLANT
COVER TIP SHEARS 8 DVNC (MISCELLANEOUS) ×2 IMPLANT
COVER TIP SHEARS 8MM DA VINCI (MISCELLANEOUS) ×2
CUTTER ECHEON FLEX ENDO 45 340 (ENDOMECHANICALS) ×4 IMPLANT
DECANTER SPIKE VIAL GLASS SM (MISCELLANEOUS) ×4 IMPLANT
DRAPE SURG IRRIG POUCH 19X23 (DRAPES) ×4 IMPLANT
DRSG TEGADERM 4X4.75 (GAUZE/BANDAGES/DRESSINGS) ×4 IMPLANT
DRSG TEGADERM 6X8 (GAUZE/BANDAGES/DRESSINGS) IMPLANT
ELECT REM PT RETURN 9FT ADLT (ELECTROSURGICAL) ×4
ELECTRODE REM PT RTRN 9FT ADLT (ELECTROSURGICAL) ×2 IMPLANT
GLOVE BIO SURGEON STRL SZ 6.5 (GLOVE) ×3 IMPLANT
GLOVE BIO SURGEONS STRL SZ 6.5 (GLOVE) ×1
GLOVE BIOGEL M STRL SZ7.5 (GLOVE) ×8 IMPLANT
GOWN STRL REUS W/TWL LRG LVL3 (GOWN DISPOSABLE) ×4 IMPLANT
HOLDER FOLEY CATH W/STRAP (MISCELLANEOUS) ×4 IMPLANT
IV LACTATED RINGERS 1000ML (IV SOLUTION) IMPLANT
KIT ACCESSORY DA VINCI DISP (KITS) ×2
KIT ACCESSORY DVNC DISP (KITS) ×2 IMPLANT
LIQUID BAND (GAUZE/BANDAGES/DRESSINGS) ×4 IMPLANT
MANIFOLD NEPTUNE II (INSTRUMENTS) ×4 IMPLANT
NDL SAFETY ECLIPSE 18X1.5 (NEEDLE) ×2 IMPLANT
NEEDLE HYPO 18GX1.5 SHARP (NEEDLE) ×2
PACK ROBOT UROLOGY CUSTOM (CUSTOM PROCEDURE TRAY) ×4 IMPLANT
RELOAD GREEN ECHELON 45 (STAPLE) ×4 IMPLANT
SET TUBE IRRIG SUCTION NO TIP (IRRIGATION / IRRIGATOR) ×4 IMPLANT
SHEET LAVH (DRAPES) ×4 IMPLANT
SOLUTION ELECTROLUBE (MISCELLANEOUS) ×4 IMPLANT
SUT ETHILON 3 0 PS 1 (SUTURE) ×4 IMPLANT
SUT MNCRL 3 0 RB1 (SUTURE) ×2 IMPLANT
SUT MNCRL 3 0 VIOLET RB1 (SUTURE) ×2 IMPLANT
SUT MNCRL AB 4-0 PS2 18 (SUTURE) ×8 IMPLANT
SUT MONOCRYL 3 0 RB1 (SUTURE) ×4
SUT VIC AB 0 CT1 27 (SUTURE) ×2
SUT VIC AB 0 CT1 27XBRD ANTBC (SUTURE) ×2 IMPLANT
SUT VIC AB 0 UR5 27 (SUTURE) ×4 IMPLANT
SUT VIC AB 2-0 SH 27 (SUTURE) ×2
SUT VIC AB 2-0 SH 27X BRD (SUTURE) ×2 IMPLANT
SUT VICRYL 0 UR6 27IN ABS (SUTURE) ×8 IMPLANT
SYR 27GX1/2 1ML LL SAFETY (SYRINGE) ×4 IMPLANT
TOWEL OR 17X26 10 PK STRL BLUE (TOWEL DISPOSABLE) ×4 IMPLANT
TOWEL OR NON WOVEN STRL DISP B (DISPOSABLE) ×4 IMPLANT
WATER STERILE IRR 1500ML POUR (IV SOLUTION) IMPLANT

## 2015-06-01 NOTE — Anesthesia Postprocedure Evaluation (Signed)
  Anesthesia Post-op Note  Patient: Joshua Hodges  Procedure(s) Performed: Procedure(s): ROBOTIC ASSISTED LAPAROSCOPIC RADICAL PROSTATECTOMY LEVEL 2 (N/A) BILATERAL LYMPHADENECTOMY (Bilateral)  Patient Location: PACU  Anesthesia Type:General  Level of Consciousness: awake, alert  and oriented  Airway and Oxygen Therapy: Patient Spontanous Breathing  Post-op Pain: minimal  Post-op Assessment: Post-op Vital signs reviewed and Patient's Cardiovascular Status Stable              Post-op Vital Signs: Reviewed and stable  Last Vitals:  Filed Vitals:   06/01/15 1530  BP: 140/85  Pulse: 79  Temp:   Resp: 14    Complications: No apparent anesthesia complications

## 2015-06-01 NOTE — Discharge Instructions (Signed)

## 2015-06-01 NOTE — Progress Notes (Signed)
Patient ID: Joshua Hodges, male   DOB: Jan 14, 1951, 64 y.o.   MRN: [id number]  Post-op note  Subjective: The patient is doing well.  No complaints.  Objective: Vital signs in last 24 hours: Temp:  [97.8 F (36.6 C)-98.4 F (36.9 C)] 98.4 F (36.9 C) (09/29 1636) Pulse Rate:  [73-89] 84 (09/29 1636) Resp:  [13-15] 15 (09/29 1636) BP: (115-140)/(74-87) 117/74 mmHg (09/29 1636) SpO2:  [97 %-100 %] 100 % (09/29 1636) Weight:  [83.099 kg (183 lb 3.2 oz)-84.3 kg (185 lb 13.6 oz)] 84.3 kg (185 lb 13.6 oz) (09/29 1636)  Intake/Output from previous day:   Intake/Output this shift: Total I/O In: 3100 [I.V.:2100; IV Piggyback:1000] Out: 615 [Urine:450; Drains:65; Blood:100]  Physical Exam:  General: Alert and oriented. Abdomen: Soft, Nondistended. Incisions: Clean and dry. GU: Urine clear.  Lab Results:  Recent Labs  06/01/15 1532  HGB 15.2  HCT 45.1    Assessment/Plan: POD#0   1) Continue to monitor, ambulate, IS   Pryor Curia. MD   LOS: 0 days   Noreta Kue,LES 06/01/2015, 5:15 PM

## 2015-06-01 NOTE — Interval H&P Note (Signed)
History and Physical Interval Note:  06/01/2015 11:06 AM  Joshua Hodges  has presented today for surgery, with the diagnosis of PROSTATE CANCER  The various methods of treatment have been discussed with the patient and family. After consideration of risks, benefits and other options for treatment, the patient has consented to  Procedure(s): ROBOTIC ASSISTED LAPAROSCOPIC RADICAL PROSTATECTOMY LEVEL 2 (N/A) BILATERAL LYMPHADENECTOMY (Bilateral) as a surgical intervention .  The patient's history has been reviewed, patient examined, no change in status, stable for surgery.  I have reviewed the patient's chart and labs.  Questions were answered to the patient's satisfaction.     Gisel Vipond,LES

## 2015-06-01 NOTE — Transfer of Care (Signed)
Immediate Anesthesia Transfer of Care Note  Patient: Joshua Hodges  Procedure(s) Performed: Procedure(s): ROBOTIC ASSISTED LAPAROSCOPIC RADICAL PROSTATECTOMY LEVEL 2 (N/A) BILATERAL LYMPHADENECTOMY (Bilateral)  Patient Location: PACU  Anesthesia Type:General  Level of Consciousness: awake, alert  and oriented  Airway & Oxygen Therapy: Patient Spontanous Breathing and Patient connected to face mask oxygen  Post-op Assessment: Report given to RN and Post -op Vital signs reviewed and stable  Post vital signs: Reviewed and stable  Last Vitals: There were no vitals filed for this visit.  Complications: No apparent anesthesia complications

## 2015-06-01 NOTE — Anesthesia Procedure Notes (Signed)
Procedure Name: Intubation Date/Time: 06/01/2015 11:24 AM Performed by: Noralyn Pick D Pre-anesthesia Checklist: Patient identified, Emergency Drugs available, Suction available and Patient being monitored Patient Re-evaluated:Patient Re-evaluated prior to inductionOxygen Delivery Method: Circle System Utilized Preoxygenation: Pre-oxygenation with 100% oxygen Intubation Type: IV induction Ventilation: Mask ventilation without difficulty Laryngoscope Size: Mac and 4 Grade View: Grade III Tube type: Oral Number of attempts: 2 Airway Equipment and Method: Stylet and Oral airway Placement Confirmation: ETT inserted through vocal cords under direct vision,  positive ETCO2 and breath sounds checked- equal and bilateral Secured at: 22 cm Tube secured with: Tape Dental Injury: Teeth and Oropharynx as per pre-operative assessment  Difficulty Due To: Difficulty was unanticipated Comments: Difficult d/t prominent I tooth

## 2015-06-01 NOTE — Op Note (Signed)
Preoperative diagnosis: Clinically localized adenocarcinoma of the prostate (clinical stage T1c N0 M0)  Postoperative diagnosis: Clinically localized adenocarcinoma of the prostate (clinical stage T1c N0 M0)  Procedure:  1. Robotic assisted laparoscopic radical prostatectomy (bilateral nerve sparing) 2. Bilateral robotic assisted laparoscopic pelvic lymphadenectomy  Surgeon: Pryor Curia. M.D.  Assistant: Debbrah Alar, PA-C  Resident: Dr. Jearld Adjutant  Anesthesia: General  Complications: None  EBL: 100 mL  IVF:  1900 mL crystalloid  Specimens: 1. Prostate and seminal vesicles 2. Right pelvic lymph nodes 3. Left pelvic lymph nodes  Disposition of specimens: Pathology  Drains: 1. 20 Fr coude catheter 2. # 19 Blake pelvic drain  Indication: Joshua Hodges is a 64 y.o. year old patient with clinically localized prostate cancer.  After a thorough review of the management options for treatment of prostate cancer, he elected to proceed with surgical therapy and the above procedure(s).  We have discussed the potential benefits and risks of the procedure, side effects of the proposed treatment, the likelihood of the patient achieving the goals of the procedure, and any potential problems that might occur during the procedure or recuperation. Informed consent has been obtained.  Description of procedure:  The patient was taken to the operating room and a general anesthetic was administered. He was given preoperative antibiotics, placed in the dorsal lithotomy position, and prepped and draped in the usual sterile fashion. Next a preoperative timeout was performed. A urethral catheter was placed into the bladder and a site was selected near the umbilicus for placement of the camera port. This was placed using a standard open Hassan technique which allowed entry into the peritoneal cavity under direct vision and without difficulty. A 12 mm port was placed and a pneumoperitoneum  established. The camera was then used to inspect the abdomen and there was no evidence of any intra-abdominal injuries or other abnormalities. The remaining abdominal ports were then placed. 8 mm robotic ports were placed in the right lower quadrant, left lower quadrant, and far left lateral abdominal wall. A 5 mm port was placed in the right upper quadrant and a 12 mm port was placed in the right lateral abdominal wall for laparoscopic assistance. All ports were placed under direct vision without difficulty. The surgical cart was then docked.   Utilizing the cautery scissors, the bladder was reflected posteriorly allowing entry into the space of Retzius and identification of the endopelvic fascia and prostate. The periprostatic fat was then removed from the prostate allowing full exposure of the endopelvic fascia. The endopelvic fascia was then incised from the apex back to the base of the prostate bilaterally and the underlying levator muscle fibers were swept laterally off the prostate thereby isolating the dorsal venous complex. The dorsal vein was then stapled and divided with a 45 mm Flex Echelon stapler. Attention then turned to the bladder neck which was divided anteriorly thereby allowing entry into the bladder and exposure of the urethral catheter. The catheter balloon was deflated and the catheter was brought into the operative field and used to retract the prostate anteriorly. The posterior bladder neck was then examined and was divided allowing further dissection between the bladder and prostate posteriorly until the vasa deferentia and seminal vessels were identified. The vasa deferentia were isolated, divided, and lifted anteriorly. The seminal vesicles were dissected down to their tips with care to control the seminal vascular arterial blood supply. These structures were then lifted anteriorly and the space between Denonvillier's fascia and the anterior rectum was  developed with a combination of  sharp and blunt dissection. This isolated the vascular pedicles of the prostate.  The lateral prostatic fascia was then sharply incised allowing release of the neurovascular bundles bilaterally. The vascular pedicles of the prostate were then ligated with Weck clips between the prostate and neurovascular bundles and divided with sharp cold scissor dissection resulting in neurovascular bundle preservation. The neurovascular bundles were then separated off the apex of the prostate and urethra bilaterally.  The urethra was then sharply transected allowing the prostate specimen to be disarticulated. The pelvis was copiously irrigated and hemostasis was ensured. There was no evidence for rectal injury.  Attention then turned to the right pelvic sidewall. The fibrofatty tissue between the external iliac vein, confluence of the iliac vessels, hypogastric artery, and Cooper's ligament was dissected free from the pelvic sidewall with care to preserve the obturator nerve. Weck clips were used for lymphostasis and hemostasis. An identical procedure was performed on the contralateral side and the lymphatic packets were removed for permanent pathologic analysis.  Attention then turned to the urethral anastomosis. A 2-0 Vicryl slip knot was placed between Denonvillier's fascia, the posterior bladder neck, and the posterior urethra to reapproximate these structures. A double-armed 3-0 Monocryl suture was then used to perform a 360 running tension-free anastomosis between the bladder neck and urethra. A new urethral catheter was then placed into the bladder and irrigated. There were no blood clots within the bladder and the anastomosis appeared to be watertight. A #19 Blake drain was then brought through the left lateral 8 mm port site and positioned appropriately within the pelvis. It was secured to the skin with a nylon suture. The surgical cart was then undocked. The right lateral 12 mm port site was closed at the  fascial level with a 0 Vicryl suture placed laparoscopically. All remaining ports were then removed under direct vision. The prostate specimen was removed intact within the Endopouch retrieval bag via the periumbilical camera port site. This fascial opening was closed with two running 0 Vicryl sutures. 0.25% Marcaine was then injected into all port sites and all incisions were reapproximated at the skin level with 4-0 Monocryl subcuticular sutures and Dermabond. The patient appeared to tolerate the procedure well and without complications. The patient was able to be extubated and transferred to the recovery unit in satisfactory condition.   Pryor Curia MD

## 2015-06-01 NOTE — Progress Notes (Signed)
Oncology Nurse Navigator Documentation  Oncology Nurse Navigator Flowsheets 05/11/2015 05/12/2015 06/01/2015  Referral date to RadOnc/MedOnc - - -  Navigator Encounter Type Telephone Clinic/MDC -  Patient Visit Type - Initial Surgery- Visited with Joshua Hodges, his wife and daughter post prostatectomy. He states the surgery went well. I will continue to follow and asked him and his family to call me with any questions or concerns. They voiced understanding.  Barriers/Navigation Needs - No barriers at this time No barriers at this time  Interventions - - -  Support Groups/Services - Friends and Family Friends and Family  Time Spent with Patient [phone]

## 2015-06-01 NOTE — Anesthesia Preprocedure Evaluation (Addendum)
Anesthesia Evaluation  Patient identified by MRN, date of birth, ID band Patient awake    Reviewed: Allergy & Precautions, NPO status , Patient's Chart, lab work & pertinent test results  Airway Mallampati: II  TM Distance: >3 FB Neck ROM: Full    Dental no notable dental hx.    Pulmonary neg pulmonary ROS,    Pulmonary exam normal breath sounds clear to auscultation       Cardiovascular Exercise Tolerance: Good hypertension, Pt. on medications negative cardio ROS Normal cardiovascular exam Rhythm:Regular Rate:Normal     Neuro/Psych negative neurological ROS  negative psych ROS   GI/Hepatic Neg liver ROS, GERD  Medicated,  Endo/Other  negative endocrine ROS  Renal/GU negative Renal ROS  negative genitourinary   Musculoskeletal negative musculoskeletal ROS (+)   Abdominal   Peds negative pediatric ROS (+)  Hematology negative hematology ROS (+)   Anesthesia Other Findings   Reproductive/Obstetrics negative OB ROS                            Anesthesia Physical Anesthesia Plan  ASA: II  Anesthesia Plan: General   Post-op Pain Management:    Induction: Intravenous  Airway Management Planned: Oral ETT  Additional Equipment:   Intra-op Plan:   Post-operative Plan: Extubation in OR  Informed Consent: I have reviewed the patients History and Physical, chart, labs and discussed the procedure including the risks, benefits and alternatives for the proposed anesthesia with the patient or authorized representative who has indicated his/her understanding and acceptance.   Dental advisory given  Plan Discussed with: CRNA  Anesthesia Plan Comments:         Anesthesia Quick Evaluation

## 2015-06-02 LAB — HEMOGLOBIN AND HEMATOCRIT, BLOOD
HCT: 39.6 % (ref 39.0–52.0)
Hemoglobin: 13.1 g/dL (ref 13.0–17.0)

## 2015-06-02 MED ORDER — HYDROCODONE-ACETAMINOPHEN 5-325 MG PO TABS
1.0000 | ORAL_TABLET | Freq: Four times a day (QID) | ORAL | Status: DC | PRN
  Administered 2015-06-02: 1 via ORAL
  Filled 2015-06-02: qty 1

## 2015-06-02 MED ORDER — BISACODYL 10 MG RE SUPP
10.0000 mg | Freq: Once | RECTAL | Status: AC
  Administered 2015-06-02: 10 mg via RECTAL
  Filled 2015-06-02: qty 1

## 2015-06-02 NOTE — Care Management Note (Signed)
Case Management Note  Patient Details  Name: Elma Limas MRN: [id number] Date of Birth: 1951/01/29  Subjective/Objective:   64 y/o m admitted w/Prostate Ca. S/p lap radical prostatectomy.From home.                 Action/Plan:d/c home no needs or orders.   Expected Discharge Date:                  Expected Discharge Plan:  Home/Self Care  In-House Referral:     Discharge planning Services  CM Consult  Post Acute Care Choice:    Choice offered to:     DME Arranged:    DME Agency:     HH Arranged:    [place] Agency:     Status of Service:  Completed, signed off  Medicare Important Message Given:    Date Medicare IM Given:    Medicare IM give by:    Date Additional Medicare IM Given:    Additional Medicare Important Message give by:     If discussed at [place] of Stay Meetings, dates discussed:    Additional Comments:  Dessa Phi, RN 06/02/2015, 11:58 AM

## 2015-06-02 NOTE — Progress Notes (Signed)
Patient ID: Joshua Hodges, male   DOB: 12/23/1950, 64 y.o.   MRN: [id number]  1 Day Post-Op Subjective: The patient is doing well.  No nausea or vomiting. Pain is adequately controlled.  Objective: Vital signs in last 24 hours: Temp:  [97.8 F (36.6 C)-98.6 F (37 C)] 98.3 F (36.8 C) (09/30 0441) Pulse Rate:  [63-89] 63 (09/30 0441) Resp:  [13-16] 16 (09/30 0441) BP: (101-140)/(61-87) 118/69 mmHg (09/30 0441) SpO2:  [97 %-100 %] 99 % (09/30 0441) Weight:  [83.099 kg (183 lb 3.2 oz)-84.3 kg (185 lb 13.6 oz)] 84.3 kg (185 lb 13.6 oz) (09/29 1636)  Intake/Output from previous day: 09/29 0701 - 09/30 0700 In: 5390 [P.O.:220; I.V.:4070; IV Piggyback:1100] Out: 2899 [Urine:2625; Drains:174; Blood:100] Intake/Output this shift:    Physical Exam:  General: Alert and oriented. CV: RRR Lungs: Clear bilaterally. GI: Soft, Nondistended. Incisions: Clean, dry, and intact Urine: Clear Extremities: Nontender, no erythema, no edema.  Lab Results:  Recent Labs  06/01/15 1532 06/02/15 0604  HGB 15.2 13.1  HCT 45.1 39.6      Assessment/Plan: POD# 1 s/p robotic prostatectomy.  1) SL IVF 2) Ambulate, Incentive spirometry 3) Transition to oral pain medication 4) Dulcolax suppository 5) D/C pelvic drain 6) Plan for likely discharge later today   Joshua Hodges. MD   LOS: 1 day   Joshua Hodges,LES 06/02/2015, 7:30 AM

## 2015-06-02 NOTE — Discharge Summary (Signed)
  Date of admission: 06/01/2015  Date of discharge: 06/02/2015  Admission diagnosis: Prostate Cancer  Discharge diagnosis: Prostate Cancer  History and Physical: For full details, please see admission history and physical. Briefly, Joshua Hodges is a 64 y.o. gentleman with localized prostate cancer.  After discussing management/treatment options, he elected to proceed with surgical treatment.  Hospital Course: Joshua Hodges was taken to the operating room on 06/01/2015 and underwent a robotic assisted laparoscopic radical prostatectomy. He tolerated this procedure well and without complications. Postoperatively, he was able to be transferred to a regular hospital room following recovery from anesthesia.  He was able to begin ambulating the night of surgery. He remained hemodynamically stable overnight.  He had excellent urine output with appropriately minimal output from his pelvic drain and his pelvic drain was removed on POD #1.  He was transitioned to oral pain medication, tolerated a clear liquid diet, and had met all discharge criteria and was able to be discharged home later on POD#1.  Laboratory values:  Recent Labs  06/01/15 1532 06/02/15 0604  HGB 15.2 13.1  HCT 45.1 39.6    Disposition: Home  Discharge instruction: He was instructed to be ambulatory but to refrain from heavy lifting, strenuous activity, or driving. He was instructed on urethral catheter care.  Discharge medications:     Medication List    STOP taking these medications        Vitamin D3 5000 UNITS Tabs      TAKE these medications        ALKA-SELTZER HEARTBURN + GAS 750-80 MG Chew  Generic drug:  Calcium Carbonate-Simethicone  Chew 1 tablet by mouth daily as needed (heartburn).     amLODipine 10 MG tablet  Commonly known as:  NORVASC  Take 10 mg by mouth daily.     atorvastatin 20 MG tablet  Commonly known as:  LIPITOR  Take 20 mg by mouth every morning.     clonazePAM 1 MG tablet  Commonly  known as:  KLONOPIN  Take 1 mg by mouth 2 (two) times daily as needed for anxiety.     HYDROcodone-acetaminophen 5-325 MG tablet  Commonly known as:  NORCO  Take 1-2 tablets by mouth every 6 (six) hours as needed.     LORazepam 0.5 MG tablet  Commonly known as:  ATIVAN  Take 0.5 mg by mouth at bedtime as needed for sleep. 1/2 to 1 tab     omeprazole 20 MG tablet  Commonly known as:  PRILOSEC OTC  Take 20 mg by mouth daily as needed (hearburn).     sulfamethoxazole-trimethoprim 800-160 MG tablet  Commonly known as:  BACTRIM DS,SEPTRA DS  Take 1 tablet by mouth 2 (two) times daily. Start the day prior to foley removal appointment        Followup: He will followup in 1 week for catheter removal and to discuss his surgical pathology results.

## 2015-07-31 ENCOUNTER — Ambulatory Visit (INDEPENDENT_AMBULATORY_CARE_PROVIDER_SITE_OTHER): Payer: 59 | Admitting: Nurse Practitioner

## 2015-07-31 ENCOUNTER — Other Ambulatory Visit: Payer: Self-pay

## 2015-07-31 ENCOUNTER — Encounter: Payer: Self-pay | Admitting: Nurse Practitioner

## 2015-07-31 ENCOUNTER — Telehealth: Payer: Self-pay

## 2015-07-31 VITALS — BP 120/76 | HR 70 | Temp 97.6°F | Ht 68.0 in | Wt 179.6 lb

## 2015-07-31 DIAGNOSIS — K219 Gastro-esophageal reflux disease without esophagitis: Secondary | ICD-10-CM

## 2015-07-31 DIAGNOSIS — K5900 Constipation, unspecified: Secondary | ICD-10-CM | POA: Diagnosis not present

## 2015-07-31 DIAGNOSIS — R935 Abnormal findings on diagnostic imaging of other abdominal regions, including retroperitoneum: Secondary | ICD-10-CM

## 2015-07-31 DIAGNOSIS — K649 Unspecified hemorrhoids: Secondary | ICD-10-CM

## 2015-07-31 MED ORDER — PEG 3350-KCL-NA BICARB-NACL 420 G PO SOLR: 4000.0000 mL | Freq: Once | ORAL | Status: DC

## 2015-07-31 MED ORDER — HYDROCORTISONE 2.5 % RE CREA: 1.0000 "application " | TOPICAL_CREAM | Freq: Two times a day (BID) | RECTAL | Status: DC

## 2015-07-31 NOTE — Progress Notes (Signed)
CC'D TO PCP °

## 2015-07-31 NOTE — Assessment & Plan Note (Signed)
History of hemorrhoids with occasional flareups. Anusol rectal cream sent to the pharmacy or when necessary symptoms. Follow-up as needed.

## 2015-07-31 NOTE — Patient Instructions (Signed)
1. We'll schedule your procedure for you. 2. Continue taking the stool softeners as you have been. 3. Consent per prescription and your pharmacy for Anusol cream. He can apply these your hemorrhoids twice a day for up to 10 days at a time for when air flaring up.  4. Return for follow-up as needed. 5. Further recommendations to be based on results of your procedure.

## 2015-07-31 NOTE — Telephone Encounter (Signed)
Per [facility] PA # for colonoscopy is [id number]

## 2015-07-31 NOTE — Assessment & Plan Note (Signed)
Abnormal CT abdomen and pelvis as noted in history of present illness. Radiology recommended correlation with colonoscopy. We had to hold off on this procedure due to eminent surgery for prostate cancer. He is now 8 weeks postop and okay to proceed per recommendations from urology. Recovering from surgery well. Generally asymptomatic from a GI standpoint other than noted above. We'll proceed with colonoscopy at this time.  Proceed with TCS with 25mg  pre-procedure Phenergan with Dr. Gala Romney in near future: the risks, benefits, and alternatives have been discussed with the patient in detail. The patient states understanding and desires to proceed.  Patient is not on any anticoagulants, antidepressants, her chronic pain medications. He is on Klonopin, which she has not taken in the past 3 weeks, and Ativan typically one to 2 times a week for insomnia. He was on these medications at the time of his last colonoscopy and was successfully completed with conscious sedation. Patient states no poor outcomes related to sedation. At this point we will provide for 25 mg of preprocedure Phenergan to ensure adequate sedation.

## 2015-07-31 NOTE — Assessment & Plan Note (Signed)
Symptoms much improved on daily stool softener. Continue adequate fiber and water intake. Continue taking stool softeners. Follow-up as needed.

## 2015-07-31 NOTE — Progress Notes (Signed)
Referring Provider: Lemmie Evens, MD Primary Care Physician:  Robert Bellow, MD Primary GI:  Dr. Gala Romney  Chief Complaint  Patient presents with  . Colonoscopy    HPI:   64 year old male presents for f/u on GERD, constipation, and abnormal CT abdomen/pelvis last seen here 05/30/15. CT abdomen and pelvis done during workup for staging found focal area of luminal narrowing and mural thickening of the hepatic flexure of the colon and per radiologist recommendation, although this may be simply related to under distention of the colon, correlation with colonoscopy is recommended to fully evaluate.. Last colonoscopy 2014 with surgeon essentially normal. His procedure was held due to impending prostate surgery related to high risk prostate cancer. Urology recommended waiting until 8 weeks post-op for TCS, surgery completed 06/01/15. His GERD symptoms were not well controlled at last visit, elected to hold off on PPI and try symptomatic treatments. Constipation likely due to decreased fiber/water and recommended increasing these in his diet and OTC fiber supplementation.  Today he states his constipation is improved post-op, is taking OTC stool softener. Has a bowel movement about every other day which is soft and formed consistent with [place] 4. GERD is better as well, only 1-2 episodes in 2 months (since surgery). He is trying dietary control and not eating after 7 pm. Admits occasional mild abdominal pain though he feels this may be due to starting back at work and yard work. Denies N/V, hematochezia, melena, fever, chills. Has lost about 10 pounds initially after surgery, but has gained 3 pounds back as of today. Also has hemorrhoids which are intermittently bothersome. Denies chest pain, dyspnea, dizziness, lightheadedness, syncope, near syncope. Denies any other upper or lower GI symptoms.  Past Medical History  Diagnosis Date  . Hypertension   . Hypercholesteremia   . GERD  (gastroesophageal reflux disease)   . Hemorrhoids   . Rectal bleeding   . H/O colonoscopy   . Thyroid disease none in last 9 months    for 3 months, then took off medication  . Low testosterone   . Joint pain   . Prostate cancer [facility])     Past Surgical History  Procedure Laterality Date  . Colonoscopy    . Colonoscopy N/A 10/27/2012    jenkins: 1. normal colon 2. internal hemorrhoids   . Vasectomy  1991  . Prostate biopsy    . Hernia repair  1960    at age 55  . Robot assisted laparoscopic radical prostatectomy N/A 06/01/2015    Procedure: ROBOTIC ASSISTED LAPAROSCOPIC RADICAL PROSTATECTOMY LEVEL 2;  Surgeon: Raynelle Bring, MD;  Location: [facility];  Service: Urology;  Laterality: N/A;  . Lymphadenectomy Bilateral 06/01/2015    Procedure: BILATERAL LYMPHADENECTOMY;  Surgeon: Raynelle Bring, MD;  Location: [facility];  Service: Urology;  Laterality: Bilateral;    Current Outpatient Prescriptions  Medication Sig Dispense Refill  . amLODipine (NORVASC) 10 MG tablet Take 5 mg by mouth daily.     Marland Kitchen atorvastatin (LIPITOR) 20 MG tablet Take 20 mg by mouth every morning.     . Calcium Carbonate-Simethicone (ALKA-SELTZER HEARTBURN + GAS) 750-80 MG CHEW Chew 1 tablet by mouth daily as needed (heartburn).    . cholecalciferol (VITAMIN D) 1000 UNITS tablet Take 1,000 Units by mouth daily.    . clonazePAM (KLONOPIN) 1 MG tablet Take 1 mg by mouth 2 (two) times daily as needed for anxiety.     Marland Kitchen LORazepam (ATIVAN) 0.5 MG tablet Take 0.5 mg by mouth at  bedtime as needed for sleep. 1/2 to 1 tab    . omeprazole (PRILOSEC OTC) 20 MG tablet Take 20 mg by mouth daily as needed (hearburn).    Marland Kitchen HYDROcodone-acetaminophen (NORCO) 5-325 MG tablet Take 1-2 tablets by mouth every 6 (six) hours as needed. (Patient not taking: Reported on 07/31/2015) 30 tablet 0  . sulfamethoxazole-trimethoprim (BACTRIM DS,SEPTRA DS) 800-160 MG tablet Take 1 tablet by mouth 2 (two) times daily. Start the day prior to foley removal  appointment (Patient not taking: Reported on 07/31/2015) 6 tablet 0   No current facility-administered medications for this visit.    Allergies as of 07/31/2015  . (No Known Allergies)    Family History  Problem Relation Age of Onset  . Colon cancer Neg Hx   . Multiple myeloma Father   . Cancer Paternal Uncle     kidney cancer    Social History   Social History  . Marital Status: Married    Spouse Name: N/A  . Number of Children: N/A  . Years of Education: N/A   Social History Main Topics  . Smoking status: Never Smoker   . Smokeless tobacco: Never Used  . Alcohol Use: No     Comment: none since 1985  . Drug Use: Yes    Special: Marijuana     Comment: none since 1985  . Sexual Activity: Yes   Other Topics Concern  . None   Social History Narrative    Review of Systems: General: Negative for anorexia, weight loss, fever, chills, fatigue, weakness. ENT: Negative for hoarseness, difficulty swallowing. CV: Negative for chest pain, angina, palpitations, peripheral edema.  Respiratory: Negative for dyspnea at rest, cough, sputum, wheezing.  GI: See history of present illness. MS: Negative for joint pain, low back pain.  Derm: Negative for rash or itching.  Endo: Negative for unusual weight change.    Physical Exam: BP 120/76 mmHg  Pulse 70  Temp(Src) 97.6 F (36.4 C) (Oral)  Ht 5' 8"  (1.727 m)  Wt 179 lb 9.6 oz (81.466 kg)  BMI 27.31 kg/m2 General:   Alert and oriented. Pleasant and cooperative. Well-nourished and well-developed.  Head:  Normocephalic and atraumatic. Eyes:  Without icterus, sclera clear and conjunctiva pink.  Ears:  Normal auditory acuity. Cardiovascular:  S1, S2 present without murmurs appreciated. Extremities without clubbing or edema. Respiratory:  Clear to auscultation bilaterally. No wheezes, rales, or rhonchi. No distress.  Gastrointestinal:  +BS, soft, non-tender and non-distended. No HSM noted. No guarding or rebound. No masses  appreciated.  Rectal:  Deferred  Neurologic:  Alert and oriented x4;  grossly normal neurologically. Psych:  Alert and cooperative. Normal mood and affect. Heme/Lymph/Immune: No excessive bruising noted.    07/31/2015 8:27 AM

## 2015-07-31 NOTE — Assessment & Plan Note (Signed)
Her symptoms much improved with dietary management and appropriate eating times. 2 symptomatic episodes in the past 2 months. Last office visit declined to take PPI. Continue dietary changes and follow-up as needed.

## 2015-08-08 ENCOUNTER — Other Ambulatory Visit (HOSPITAL_COMMUNITY): Payer: Self-pay | Admitting: Urology

## 2015-08-08 DIAGNOSIS — D49511 Neoplasm of unspecified behavior of right kidney: Secondary | ICD-10-CM

## 2015-08-11 ENCOUNTER — Ambulatory Visit (HOSPITAL_COMMUNITY)
Admission: RE | Admit: 2015-08-11 | Discharge: 2015-08-11 | Disposition: A | Discharge status: Home / Self Care | Payer: 59 | Source: Ambulatory Visit | Attending: Internal Medicine | Admitting: Internal Medicine

## 2015-08-11 ENCOUNTER — Encounter (HOSPITAL_COMMUNITY): Payer: Self-pay | Admitting: *Deleted

## 2015-08-11 ENCOUNTER — Encounter (HOSPITAL_COMMUNITY)
Admission: RE | Disposition: A | Discharge status: Home / Self Care | Payer: Self-pay | Source: Ambulatory Visit | Attending: Internal Medicine

## 2015-08-11 DIAGNOSIS — Z79899 Other long term (current) drug therapy: Secondary | ICD-10-CM | POA: Insufficient documentation

## 2015-08-11 DIAGNOSIS — K5900 Constipation, unspecified: Secondary | ICD-10-CM | POA: Diagnosis not present

## 2015-08-11 DIAGNOSIS — I10 Essential (primary) hypertension: Secondary | ICD-10-CM | POA: Insufficient documentation

## 2015-08-11 DIAGNOSIS — K649 Unspecified hemorrhoids: Secondary | ICD-10-CM | POA: Diagnosis not present

## 2015-08-11 DIAGNOSIS — K219 Gastro-esophageal reflux disease without esophagitis: Secondary | ICD-10-CM | POA: Diagnosis not present

## 2015-08-11 DIAGNOSIS — Z8546 Personal history of malignant neoplasm of prostate: Secondary | ICD-10-CM | POA: Diagnosis not present

## 2015-08-11 DIAGNOSIS — E7800 Pure hypercholesterolemia, unspecified: Secondary | ICD-10-CM | POA: Diagnosis not present

## 2015-08-11 DIAGNOSIS — R935 Abnormal findings on diagnostic imaging of other abdominal regions, including retroperitoneum: Secondary | ICD-10-CM | POA: Diagnosis not present

## 2015-08-11 DIAGNOSIS — D123 Benign neoplasm of transverse colon: Secondary | ICD-10-CM

## 2015-08-11 DIAGNOSIS — Z860100 Personal history of colon polyps, unspecified: Secondary | ICD-10-CM | POA: Insufficient documentation

## 2015-08-11 DIAGNOSIS — R933 Abnormal findings on diagnostic imaging of other parts of digestive tract: Secondary | ICD-10-CM | POA: Insufficient documentation

## 2015-08-11 DIAGNOSIS — K921 Melena: Secondary | ICD-10-CM | POA: Diagnosis present

## 2015-08-11 HISTORY — PX: COLONOSCOPY: SHX5424

## 2015-08-11 SURGERY — COLONOSCOPY
Anesthesia: Moderate Sedation

## 2015-08-11 MED ORDER — ONDANSETRON HCL 4 MG/2ML IJ SOLN
INTRAMUSCULAR | Status: DC | PRN
  Administered 2015-08-11: 4 mg via INTRAVENOUS

## 2015-08-11 MED ORDER — SODIUM CHLORIDE 0.9 % IJ SOLN
INTRAMUSCULAR | Status: AC
  Filled 2015-08-11: qty 3

## 2015-08-11 MED ORDER — PROMETHAZINE HCL 25 MG/ML IJ SOLN
25.0000 mg | Freq: Once | INTRAMUSCULAR | Status: AC
Start: 2015-08-11 — End: 2015-08-11
  Administered 2015-08-11: 25 mg via INTRAVENOUS

## 2015-08-11 MED ORDER — MEPERIDINE HCL 100 MG/ML IJ SOLN
INTRAMUSCULAR | Status: DC | PRN
  Administered 2015-08-11: 25 mg via INTRAVENOUS
  Administered 2015-08-11: 50 mg via INTRAVENOUS

## 2015-08-11 MED ORDER — ONDANSETRON HCL 4 MG/2ML IJ SOLN
INTRAMUSCULAR | Status: AC
  Filled 2015-08-11: qty 2

## 2015-08-11 MED ORDER — MIDAZOLAM HCL 5 MG/5ML IJ SOLN
INTRAMUSCULAR | Status: AC
  Filled 2015-08-11: qty 10

## 2015-08-11 MED ORDER — PROMETHAZINE HCL 25 MG/ML IJ SOLN
INTRAMUSCULAR | Status: DC
Start: 2015-08-11 — End: 2015-08-11
  Filled 2015-08-11: qty 1

## 2015-08-11 MED ORDER — MEPERIDINE HCL 100 MG/ML IJ SOLN
INTRAMUSCULAR | Status: AC
  Filled 2015-08-11: qty 2

## 2015-08-11 MED ORDER — SODIUM CHLORIDE 0.9 % IV SOLN
INTRAVENOUS | Status: DC
  Administered 2015-08-11: 1000 mL via INTRAVENOUS

## 2015-08-11 MED ORDER — STERILE WATER FOR IRRIGATION IR SOLN
Status: DC | PRN
  Administered 2015-08-11: 08:00:00

## 2015-08-11 MED ORDER — MIDAZOLAM HCL 5 MG/5ML IJ SOLN
INTRAMUSCULAR | Status: DC | PRN
  Administered 2015-08-11: 1 mg via INTRAVENOUS
  Administered 2015-08-11: 2 mg via INTRAVENOUS
  Administered 2015-08-11: 1 mg via INTRAVENOUS

## 2015-08-11 NOTE — Interval H&P Note (Signed)
History and Physical Interval Note:  08/11/2015 8:04 AM  Joshua Hodges  has presented today for surgery, with the diagnosis of hemorrhoids, constipation, abnormal CT  The various methods of treatment have been discussed with the patient and family. After consideration of risks, benefits and other options for treatment, the patient has consented to  Procedure(s) with comments: COLONOSCOPY (N/A) - 0815 as a surgical intervention .  The patient's history has been reviewed, patient examined, no change in status, stable for surgery.  I have reviewed the patient's chart and labs.  Questions were answered to the patient's satisfaction.     Joshua Hodges  No change. Diagnostic colonoscopy per plan.  The risks, benefits, limitations, alternatives and imponderables have been reviewed with the patient. Questions have been answered. All parties are agreeable.

## 2015-08-11 NOTE — Discharge Instructions (Signed)
Colonoscopy Discharge Instructions  Read the instructions outlined below and refer to this sheet in the next few weeks. These discharge instructions provide you with general information on caring for yourself after you leave the hospital. Your doctor may also give you specific instructions. While your treatment has been planned according to the most current medical practices available, unavoidable complications occasionally occur. If you have any problems or questions after discharge, call Dr. Gala Romney at [phone]. ACTIVITY  You may resume your regular activity, but move at a slower pace for the next 24 hours.   Take frequent rest periods for the next 24 hours.   Walking will help get rid of the air and reduce the bloated feeling in your belly (abdomen).   No driving for 24 hours (because of the medicine (anesthesia) used during the test).    Do not sign any important legal documents or operate any machinery for 24 hours (because of the anesthesia used during the test).  NUTRITION  Drink plenty of fluids.   You may resume your normal diet as instructed by your doctor.   Begin with a light meal and progress to your normal diet. Heavy or fried foods are harder to digest and may make you feel sick to your stomach (nauseated).   Avoid alcoholic beverages for 24 hours or as instructed.  MEDICATIONS  You may resume your normal medications unless your doctor tells you otherwise.  WHAT YOU CAN EXPECT TODAY  Some feelings of bloating in the abdomen.   Passage of more gas than usual.   Spotting of blood in your stool or on the toilet paper.  IF YOU HAD POLYPS REMOVED DURING THE COLONOSCOPY:  No aspirin products for 7 days or as instructed.   No alcohol for 7 days or as instructed.   Eat a soft diet for the next 24 hours.  FINDING OUT THE RESULTS OF YOUR TEST Not all test results are available during your visit. If your test results are not back during the visit, make an appointment  with your caregiver to find out the results. Do not assume everything is normal if you have not heard from your caregiver or the medical facility. It is important for you to follow up on all of your test results.  SEEK IMMEDIATE MEDICAL ATTENTION IF:  You have more than a spotting of blood in your stool.   Your belly is swollen (abdominal distention).   You are nauseated or vomiting.   You have a temperature over 101.   You have abdominal pain or discomfort that is severe or gets worse throughout the day.   Polyp and hemorrhoid information provided  Begin Benefiber 1 tablespoon twice daily.    Avoid straining.  Office visit with [place] in 4-6 weeks to consider hemorrhoid banding.  Further recommendations to follow pending review of pathology report  Hemorrhoids Hemorrhoids are swollen veins around the rectum or anus. There are two types of hemorrhoids:   Internal hemorrhoids. These occur in the veins just inside the rectum. They may poke through to the outside and become irritated and painful.  External hemorrhoids. These occur in the veins outside the anus and can be felt as a painful swelling or hard lump near the anus. CAUSES  Pregnancy.   Obesity.   Constipation or diarrhea.   Straining to have a bowel movement.   Sitting for long periods on the toilet.  Heavy lifting or other activity that caused you to strain.  Anal intercourse. SYMPTOMS  Pain.   Anal itching or irritation.   Rectal bleeding.   Fecal leakage.   Anal swelling.   One or more lumps around the anus.  DIAGNOSIS  Your caregiver may be able to diagnose hemorrhoids by visual examination. Other examinations or tests that may be performed include:   Examination of the rectal area with a gloved hand (digital rectal exam).   Examination of anal canal using a small tube (scope).   A blood test if you have lost a significant amount of blood.  A test to look inside the colon  (sigmoidoscopy or colonoscopy). TREATMENT Most hemorrhoids can be treated at home. However, if symptoms do not seem to be getting better or if you have a lot of rectal bleeding, your caregiver may perform a procedure to help make the hemorrhoids get smaller or remove them completely. Possible treatments include:   Placing a rubber band at the base of the hemorrhoid to cut off the circulation (rubber band ligation).   Injecting a chemical to shrink the hemorrhoid (sclerotherapy).   Using a tool to burn the hemorrhoid (infrared light therapy).   Surgically removing the hemorrhoid (hemorrhoidectomy).   Stapling the hemorrhoid to block blood flow to the tissue (hemorrhoid stapling).  HOME CARE INSTRUCTIONS   Eat foods with fiber, such as whole grains, beans, nuts, fruits, and vegetables. Ask your doctor about taking products with added fiber in them (fibersupplements).  Increase fluid intake. Drink enough water and fluids to keep your urine clear or pale yellow.   Exercise regularly.   Go to the bathroom when you have the urge to have a bowel movement. Do not wait.   Avoid straining to have bowel movements.   Keep the anal area dry and clean. Use wet toilet paper or moist towelettes after a bowel movement.   Medicated creams and suppositories may be used or applied as directed.   Only take over-the-counter or prescription medicines as directed by your caregiver.   Take warm sitz baths for 15-20 minutes, 3-4 times a day to ease pain and discomfort.   Place ice packs on the hemorrhoids if they are tender and swollen. Using ice packs between sitz baths may be helpful.   Put ice in a plastic bag.   Place a towel between your skin and the bag.   Leave the ice on for 15-20 minutes, 3-4 times a day.   Do not use a donut-shaped pillow or sit on the toilet for long periods. This increases blood pooling and pain.  SEEK MEDICAL CARE IF:  You have increasing pain and  swelling that is not controlled by treatment or medicine.  You have uncontrolled bleeding.  You have difficulty or you are unable to have a bowel movement.  You have pain or inflammation outside the area of the hemorrhoids. MAKE SURE YOU:  Understand these instructions.  Will watch your condition.  Will get help right away if you are not doing well or get worse.   This information is not intended to replace advice given to you by your health care provider. Make sure you discuss any questions you have with your health care provider.   Document Released: 08/16/2000 Document Revised: 08/05/2012 Document Reviewed: 06/23/2012 Elsevier Interactive Patient Education 2016 Elsevier Inc[employer]   Colon Polyps Polyps are lumps of extra tissue growing inside the body. Polyps can grow in the large intestine (colon). Most colon polyps are noncancerous (benign). However, some colon polyps can become cancerous over time. Polyps that are larger  than a pea may be harmful. To be safe, caregivers remove and test all polyps. CAUSES  Polyps form when mutations in the genes cause your cells to grow and divide even though no more tissue is needed. RISK FACTORS There are a number of risk factors that can increase your chances of getting colon polyps. They include:  Being older than 50 years.  Family history of colon polyps or colon cancer.  Long-term colon diseases, such as colitis or Crohn disease.  Being overweight.  Smoking.  Being inactive.  Drinking too much alcohol. SYMPTOMS  Most small polyps do not cause symptoms. If symptoms are present, they may include:  Blood in the stool. The stool may look dark red or black.  Constipation or diarrhea that lasts longer than 1 week. DIAGNOSIS People often do not know they have polyps until their caregiver finds them during a regular checkup. Your caregiver can use 4 tests to check for polyps:  Digital rectal exam. The caregiver wears gloves and feels  inside the rectum. This test would find polyps only in the rectum.  Barium enema. The caregiver puts a liquid called barium into your rectum before taking X-rays of your colon. Barium makes your colon look white. Polyps are dark, so they are easy to see in the X-ray pictures.  Sigmoidoscopy. A thin, flexible tube (sigmoidoscope) is placed into your rectum. The sigmoidoscope has a light and tiny camera in it. The caregiver uses the sigmoidoscope to look at the last third of your colon.  Colonoscopy. This test is like sigmoidoscopy, but the caregiver looks at the entire colon. This is the most common method for finding and removing polyps. TREATMENT  Any polyps will be removed during a sigmoidoscopy or colonoscopy. The polyps are then tested for cancer. PREVENTION  To help lower your risk of getting more colon polyps:  Eat plenty of fruits and vegetables. Avoid eating fatty foods.  Do not smoke.  Avoid drinking alcohol.  Exercise every day.  Lose weight if recommended by your caregiver.  Eat plenty of calcium and folate. Foods that are rich in calcium include milk, cheese, and broccoli. Foods that are rich in folate include chickpeas, kidney beans, and spinach. HOME CARE INSTRUCTIONS Keep all follow-up appointments as directed by your caregiver. You may need periodic exams to check for polyps. SEEK MEDICAL CARE IF: You notice bleeding during a bowel movement.   This information is not intended to replace advice given to you by your health care provider. Make sure you discuss any questions you have with your health care provider.   Document Released: 05/15/2004 Document Revised: 09/09/2014 Document Reviewed: 10/29/2011 Elsevier Interactive Patient Education [employer].

## 2015-08-11 NOTE — Op Note (Signed)
[facility] [address] [place], [address]   COLONOSCOPY PROCEDURE REPORT  PATIENT: Joshua Hodges, Joshua Hodges  MR#: [id number] BIRTHDATE: Jun 17, 1951 , 76  yrs. old GENDER: male ENDOSCOPIST: R.  Garfield Cornea, MD FACP [facility] REFERRED [id number] Alinda Money, M.D.  Lemmie Evens, M.D. PROCEDURE DATE:  09-07-2015 PROCEDURE:   Colonoscopy with snare polypectomy INDICATIONS:Abnormal hepatic flexure on recent CT; paper hematochezia. MEDICATIONS: Versed 4 mg IV and Demerol 75 mg IV.  Zofran 4 mg IV. ASA CLASS:       Class II  CONSENT: The risks, benefits, alternatives and imponderables including but not limited to bleeding, perforation as well as the possibility of a missed lesion have been reviewed.  The potential for biopsy, lesion removal, etc. have also been discussed. Questions have been answered.  All parties agreeable.  Please see the history and physical in the medical record for more information.  DESCRIPTION OF PROCEDURE:   After the risks benefits and alternatives of the procedure were thoroughly explained, informed consent was obtained.  The digital rectal exam revealed no abnormalities of the rectum.   The [id number]  endoscope was introduced through the anus and advanced to the cecum, which was identified by both the appendix and ileocecal valve. No adverse events experienced.   The quality of the prep was adequate  The instrument was then slowly withdrawn as the colon was fully examined. Estimated blood loss is zero unless otherwise noted in this procedure report.      COLON FINDINGS: Somewhat friable anal canal hemorrhoids; otherwise, normal-appearing rectal mucosa.  (1) 5 mm polyp at the hepatic flexure; otherwise, the remainder of the colonic mucosa appeared normal.  The above-mentioned polyp wassnare removed.  Patient's prep was really excellent.  I got a good look at his colon.  No constricting lesions seen anywhere.  Retroflexion was performed.  .  Withdrawal time=12 minutes 0 seconds.  The scope was withdrawn and the procedure completed. COMPLICATIONS: There were no immediate complications.  ENDOSCOPIC IMPRESSION: Friable anal canal hemorrhoids?"likely source of paper hematochezia. Single colonic polyp?"removed as described above. CT abnormality suggested likely artifactual in origin.  RECOMMENDATIONS: Follow-up on pathology.   Add Benefiber 1 tablespoon twice daily to regimen. Avoid straining. Office follow-up in 6-8 weeks to consider hemorrhoid banding.  eSigned:  R. Garfield Cornea, MD FACP [facility] Sep 07, 2015 8:51 AM   cc:  CPT CODES: ICD CODES:  The ICD and CPT codes recommended by this software are interpretations from the data that the clinical staff has captured with the software.  The verification of the translation of this report to the ICD and CPT codes and modifiers is the sole responsibility of the health care institution and practicing physician where this report was generated.  [employer] will not be held responsible for the validity of the ICD and CPT codes included on this report.  AMA assumes no liability for data contained or not contained herein. CPT is a Designer, television/film set of the [employer].  PATIENT NAME:  Joshua Hodges, Joshua Hodges MR#: [id number]

## 2015-08-11 NOTE — H&P (View-Only) (Signed)
Referring Provider: Lemmie Evens, MD Primary Care Physician:  Robert Bellow, MD Primary GI:  Dr. Gala Romney  Chief Complaint  Patient presents with  . Colonoscopy    HPI:   64 year old male presents for f/u on GERD, constipation, and abnormal CT abdomen/pelvis last seen here 05/30/15. CT abdomen and pelvis done during workup for staging found focal area of luminal narrowing and mural thickening of the hepatic flexure of the colon and per radiologist recommendation, although this may be simply related to under distention of the colon, correlation with colonoscopy is recommended to fully evaluate.. Last colonoscopy 2014 with surgeon essentially normal. His procedure was held due to impending prostate surgery related to high risk prostate cancer. Urology recommended waiting until 8 weeks post-op for TCS, surgery completed 06/01/15. His GERD symptoms were not well controlled at last visit, elected to hold off on PPI and try symptomatic treatments. Constipation likely due to decreased fiber/water and recommended increasing these in his diet and OTC fiber supplementation.  Today he states his constipation is improved post-op, is taking OTC stool softener. Has a bowel movement about every other day which is soft and formed consistent with [place] 4. GERD is better as well, only 1-2 episodes in 2 months (since surgery). He is trying dietary control and not eating after 7 pm. Admits occasional mild abdominal pain though he feels this may be due to starting back at work and yard work. Denies N/V, hematochezia, melena, fever, chills. Has lost about 10 pounds initially after surgery, but has gained 3 pounds back as of today. Also has hemorrhoids which are intermittently bothersome. Denies chest pain, dyspnea, dizziness, lightheadedness, syncope, near syncope. Denies any other upper or lower GI symptoms.  Past Medical History  Diagnosis Date  . Hypertension   . Hypercholesteremia   . GERD  (gastroesophageal reflux disease)   . Hemorrhoids   . Rectal bleeding   . H/O colonoscopy   . Thyroid disease none in last 9 months    for 3 months, then took off medication  . Low testosterone   . Joint pain   . Prostate cancer [facility])     Past Surgical History  Procedure Laterality Date  . Colonoscopy    . Colonoscopy N/A 10/27/2012    jenkins: 1. normal colon 2. internal hemorrhoids   . Vasectomy  1991  . Prostate biopsy    . Hernia repair  1960    at age 66  . Robot assisted laparoscopic radical prostatectomy N/A 06/01/2015    Procedure: ROBOTIC ASSISTED LAPAROSCOPIC RADICAL PROSTATECTOMY LEVEL 2;  Surgeon: Raynelle Bring, MD;  Location: [facility];  Service: Urology;  Laterality: N/A;  . Lymphadenectomy Bilateral 06/01/2015    Procedure: BILATERAL LYMPHADENECTOMY;  Surgeon: Raynelle Bring, MD;  Location: [facility];  Service: Urology;  Laterality: Bilateral;    Current Outpatient Prescriptions  Medication Sig Dispense Refill  . amLODipine (NORVASC) 10 MG tablet Take 5 mg by mouth daily.     Marland Kitchen atorvastatin (LIPITOR) 20 MG tablet Take 20 mg by mouth every morning.     . Calcium Carbonate-Simethicone (ALKA-SELTZER HEARTBURN + GAS) 750-80 MG CHEW Chew 1 tablet by mouth daily as needed (heartburn).    . cholecalciferol (VITAMIN D) 1000 UNITS tablet Take 1,000 Units by mouth daily.    . clonazePAM (KLONOPIN) 1 MG tablet Take 1 mg by mouth 2 (two) times daily as needed for anxiety.     Marland Kitchen LORazepam (ATIVAN) 0.5 MG tablet Take 0.5 mg by mouth at  bedtime as needed for sleep. 1/2 to 1 tab    . omeprazole (PRILOSEC OTC) 20 MG tablet Take 20 mg by mouth daily as needed (hearburn).    Marland Kitchen HYDROcodone-acetaminophen (NORCO) 5-325 MG tablet Take 1-2 tablets by mouth every 6 (six) hours as needed. (Patient not taking: Reported on 07/31/2015) 30 tablet 0  . sulfamethoxazole-trimethoprim (BACTRIM DS,SEPTRA DS) 800-160 MG tablet Take 1 tablet by mouth 2 (two) times daily. Start the day prior to foley removal  appointment (Patient not taking: Reported on 07/31/2015) 6 tablet 0   No current facility-administered medications for this visit.    Allergies as of 07/31/2015  . (No Known Allergies)    Family History  Problem Relation Age of Onset  . Colon cancer Neg Hx   . Multiple myeloma Father   . Cancer Paternal Uncle     kidney cancer    Social History   Social History  . Marital Status: Married    Spouse Name: N/A  . Number of Children: N/A  . Years of Education: N/A   Social History Main Topics  . Smoking status: Never Smoker   . Smokeless tobacco: Never Used  . Alcohol Use: No     Comment: none since 1985  . Drug Use: Yes    Special: Marijuana     Comment: none since 1985  . Sexual Activity: Yes   Other Topics Concern  . None   Social History Narrative    Review of Systems: General: Negative for anorexia, weight loss, fever, chills, fatigue, weakness. ENT: Negative for hoarseness, difficulty swallowing. CV: Negative for chest pain, angina, palpitations, peripheral edema.  Respiratory: Negative for dyspnea at rest, cough, sputum, wheezing.  GI: See history of present illness. MS: Negative for joint pain, low back pain.  Derm: Negative for rash or itching.  Endo: Negative for unusual weight change.    Physical Exam: BP 120/76 mmHg  Pulse 70  Temp(Src) 97.6 F (36.4 C) (Oral)  Ht 5' 8"  (1.727 m)  Wt 179 lb 9.6 oz (81.466 kg)  BMI 27.31 kg/m2 General:   Alert and oriented. Pleasant and cooperative. Well-nourished and well-developed.  Head:  Normocephalic and atraumatic. Eyes:  Without icterus, sclera clear and conjunctiva pink.  Ears:  Normal auditory acuity. Cardiovascular:  S1, S2 present without murmurs appreciated. Extremities without clubbing or edema. Respiratory:  Clear to auscultation bilaterally. No wheezes, rales, or rhonchi. No distress.  Gastrointestinal:  +BS, soft, non-tender and non-distended. No HSM noted. No guarding or rebound. No masses  appreciated.  Rectal:  Deferred  Neurologic:  Alert and oriented x4;  grossly normal neurologically. Psych:  Alert and cooperative. Normal mood and affect. Heme/Lymph/Immune: No excessive bruising noted.    07/31/2015 8:27 AM

## 2015-08-14 ENCOUNTER — Encounter: Payer: Self-pay | Admitting: Internal Medicine

## 2015-08-16 ENCOUNTER — Encounter (HOSPITAL_COMMUNITY): Payer: Self-pay | Admitting: Internal Medicine

## 2015-08-17 ENCOUNTER — Ambulatory Visit (HOSPITAL_COMMUNITY)
Admission: RE | Admit: 2015-08-17 | Discharge: 2015-08-17 | Disposition: A | Discharge status: Home / Self Care | Payer: 59 | Source: Ambulatory Visit | Attending: Urology | Admitting: Urology

## 2015-08-17 DIAGNOSIS — N281 Cyst of kidney, acquired: Secondary | ICD-10-CM | POA: Diagnosis not present

## 2015-08-17 DIAGNOSIS — K8020 Calculus of gallbladder without cholecystitis without obstruction: Secondary | ICD-10-CM | POA: Diagnosis not present

## 2015-08-17 DIAGNOSIS — D49511 Neoplasm of unspecified behavior of right kidney: Secondary | ICD-10-CM | POA: Diagnosis not present

## 2015-08-17 MED ORDER — GADOBENATE DIMEGLUMINE 529 MG/ML IV SOLN
20.0000 mL | Freq: Once | INTRAVENOUS | Status: AC | PRN
  Administered 2015-08-17: 16 mL via INTRAVENOUS

## 2015-09-18 ENCOUNTER — Ambulatory Visit (INDEPENDENT_AMBULATORY_CARE_PROVIDER_SITE_OTHER): Payer: 59 | Admitting: Nurse Practitioner

## 2015-09-18 ENCOUNTER — Encounter: Payer: Self-pay | Admitting: Nurse Practitioner

## 2015-09-18 VITALS — BP 136/84 | HR 66 | Temp 97.3°F | Ht 68.0 in | Wt 181.8 lb

## 2015-09-18 DIAGNOSIS — K649 Unspecified hemorrhoids: Secondary | ICD-10-CM

## 2015-09-18 DIAGNOSIS — K219 Gastro-esophageal reflux disease without esophagitis: Secondary | ICD-10-CM | POA: Diagnosis not present

## 2015-09-18 DIAGNOSIS — K5900 Constipation, unspecified: Secondary | ICD-10-CM

## 2015-09-18 DIAGNOSIS — Z86010 Personal history of colonic polyps: Secondary | ICD-10-CM | POA: Diagnosis not present

## 2015-09-18 DIAGNOSIS — R935 Abnormal findings on diagnostic imaging of other abdominal regions, including retroperitoneum: Secondary | ICD-10-CM

## 2015-09-18 NOTE — Patient Instructions (Signed)
1. We will schedule an appointment for you with Dr. Gala Romney for possible hemorrhoid banding. 2. Continue taking Anusol rectal cream as needed. 3. As we discussed, talked to your primary care doctor about your concerns of kidney disease with acid blocking medicines. 4. Return for follow-up as needed for any colon or stomach symptoms.

## 2015-09-18 NOTE — Progress Notes (Signed)
Referring Provider: Lemmie Evens, MD Primary Care Physician:  Robert Bellow, MD Primary GI:  Dr. Gala Romney  Chief Complaint  Patient presents with  . Follow-up    HPI:   65 year old male presents for follow-up on GERD, constipation, hemorrhoids and post colonoscopy follow-up. Last seen in our office 07/31/2015 at which point he was referred for colonoscopy due to abnormal CT the abdomen and pelvis with possible colonic narrowing though indeterminate as to etiology. At last office visit constipation and GERD were both under much better control with PPI and stool softeners. Hemorrhoids with occasional flareups and Anusol rectal cream was sent to the pharmacy. Colonoscopy completed on 08/11/2015 under conscious sedation. Endoscopic impression of friable anal canal hemorrhoids as likely source of toilet tissue hematochezia and single colonic polyp measuring 5 mm at the hepatic flexure removed and sent for pathology. CT abnormality suggested likely artifactual in origin. Colon polyp found to be tubular adenoma. Recommended 5 year repeat colonoscopy.  Today he states his acid acid reflux is a little better, not taking PPI. Takes Alka Seltzer chewables. Has taken it 3 times in the past week. He quit taking PPI because he read it causes kidney problems. Hemorrhoids a little better. Using Anusol as needed, and states he does feel better when using it. Constipation is improved as well. He is interested in possible hemorrhoid banding, wants it done before insurance changes in March. Deneis abdominal pain, N/V, melena. Has had toilet tissue hematochezia once in the past month. Denies chest pain, dyspnea, dizziness, lightheadedness, syncope, near syncope. Denies any other upper or lower GI symptoms.  Past Medical History  Diagnosis Date  . Hypertension   . Hypercholesteremia   . GERD (gastroesophageal reflux disease)   . Hemorrhoids   . Rectal bleeding   . H/O colonoscopy   . Thyroid disease none  in last 9 months    for 3 months, then took off medication  . Low testosterone   . Joint pain   . Prostate cancer [facility])     Past Surgical History  Procedure Laterality Date  . Colonoscopy    . Colonoscopy N/A 10/27/2012    jenkins: 1. normal colon 2. internal hemorrhoids   . Vasectomy  1991  . Prostate biopsy    . Hernia repair  1960    at age 70  . Robot assisted laparoscopic radical prostatectomy N/A 06/01/2015    Procedure: ROBOTIC ASSISTED LAPAROSCOPIC RADICAL PROSTATECTOMY LEVEL 2;  Surgeon: Raynelle Bring, MD;  Location: [facility];  Service: Urology;  Laterality: N/A;  . Lymphadenectomy Bilateral 06/01/2015    Procedure: BILATERAL LYMPHADENECTOMY;  Surgeon: Raynelle Bring, MD;  Location: [facility];  Service: Urology;  Laterality: Bilateral;  . Colonoscopy N/A 08/11/2015    RMR: Friable anal canal hemorrhoids likely source of paper hematochezia. Single colon polyp removed as described above. CT abnormality suggested likely artifactual in origin.     Current Outpatient Prescriptions  Medication Sig Dispense Refill  . amLODipine (NORVASC) 10 MG tablet Take 5 mg by mouth daily.     Marland Kitchen atorvastatin (LIPITOR) 20 MG tablet Take 20 mg by mouth every morning.     . Calcium Carbonate-Simethicone (ALKA-SELTZER HEARTBURN + GAS) 750-80 MG CHEW Chew 1 tablet by mouth daily as needed (heartburn). Reported on 09/18/2015    . cholecalciferol (VITAMIN D) 1000 UNITS tablet Take 1,000 Units by mouth daily.    . clonazePAM (KLONOPIN) 1 MG tablet Take 1 mg by mouth 2 (two) times daily as needed for anxiety.     Marland Kitchen  hydrocortisone (ANUSOL-HC) 2.5 % rectal cream Place 1 application rectally 2 (two) times daily. 30 g 1  . LORazepam (ATIVAN) 0.5 MG tablet Take 0.5 mg by mouth at bedtime as needed for sleep. Reported on 09/18/2015    . omeprazole (PRILOSEC OTC) 20 MG tablet Take 20 mg by mouth daily as needed (hearburn). Reported on 09/18/2015    . polyethylene glycol-electrolytes (NULYTELY/GOLYTELY) 420 G solution Take  4,000 mLs by mouth once. (Patient not taking: Reported on 09/18/2015) 4000 mL 0   No current facility-administered medications for this visit.    Allergies as of 09/18/2015  . (No Known Allergies)    Family History  Problem Relation Age of Onset  . Colon cancer Neg Hx   . Multiple myeloma Father   . Cancer Paternal Uncle     kidney cancer    Social History   Social History  . Marital Status: Married    Spouse Name: N/A  . Number of Children: N/A  . Years of Education: N/A   Social History Main Topics  . Smoking status: Never Smoker   . Smokeless tobacco: Never Used  . Alcohol Use: No     Comment: none since 1985  . Drug Use: Yes    Special: Marijuana     Comment: none since 1985  . Sexual Activity: Yes   Other Topics Concern  . None   Social History Narrative    Review of Systems: General: Negative for anorexia, weight loss, fever, chills, fatigue, weakness. ENT: Negative for hoarseness, difficulty swallowing. CV: Negative for chest pain, angina, palpitations, peripheral edema.  Respiratory: Negative for dyspnea at rest, cough, sputum, wheezing.  GI: See history of present illness. Endo: Negative for unusual weight change.  Heme: Negative for bruising or bleeding.   Physical Exam: BP 136/84 mmHg  Pulse 66  Temp(Src) 97.3 F (36.3 C)  Ht _0  (1.727 m)  Wt 181 lb 12.8 oz (82.464 kg)  BMI 27.65 kg/m2 General:   Alert and oriented. Pleasant and cooperative. Well-nourished and well-developed.  Head:  Normocephalic and atraumatic. Eyes:  Without icterus, sclera clear and conjunctiva pink.  Cardiovascular:  S1, S2 present without murmurs appreciated. Extremities without clubbing or edema. Respiratory:  Clear to auscultation bilaterally. No wheezes, rales, or rhonchi. No distress.  Gastrointestinal:  +BS, soft, non-tender and non-distended. No HSM noted. No guarding or rebound. No masses appreciated.  Rectal:  Deferred  Neurologic:  Alert and oriented x4;   grossly normal neurologically. Psych:  Alert and cooperative. Normal mood and affect.    09/18/2015 8:12 AM

## 2015-09-18 NOTE — Assessment & Plan Note (Signed)
Patient is improved with fiber supplementation. Continue fiber, follow up as needed.

## 2015-09-18 NOTE — Assessment & Plan Note (Signed)
Patient with a history of colon adenomas. Colonoscopy just completed found a single tubular adenoma, recommend repeat colonoscopy in 5 years. We'll ensure he is on recall for that. Return for follow-up as needed.

## 2015-09-18 NOTE — Assessment & Plan Note (Signed)
Patient with internal hemorrhoids noted on colonoscopy. Is taking Anusol as needed. Would like to discuss hemorrhoid banding as an option, ideally to be done before March when his insurance changes. We will set him up for a banding appointment before then. Return for follow-up as needed.

## 2015-09-18 NOTE — Assessment & Plan Note (Signed)
Abnormality of the hepatic flexure on CT scan demonstrated to be an artifact as his colonoscopy was clear except for single polyp which is removed in the hepatic flexure. The polyp was not overtly large, deemed to be tubular adenoma and due for repeat colonoscopy in 5 years.

## 2015-09-18 NOTE — Progress Notes (Signed)
cc'ed to pcp °

## 2015-09-18 NOTE — Assessment & Plan Note (Signed)
He states his GERD is better controlled. However, at last visit he was having breakthrough symptoms 1-2 times every couple months. Currently he stopped taking his PPI because he is worried about kidney disease after seeing an add talking about that as a side effect. Is now taking Alka-Seltzer chews. Has had to take it 3 times in the past week. Explain to him how his symptoms are not as well controlled. Encouraged him to speak to his primary care provider about kidney disease and PPI as I discussed with him that his renal function is likely monitored closely at least once a year with regular primary care visits. He'll discuss it with his PCP recommended and decide if he wants to continue taking PPI.

## 2015-10-03 ENCOUNTER — Encounter: Payer: Self-pay | Admitting: Internal Medicine

## 2015-10-03 ENCOUNTER — Ambulatory Visit: Payer: 59 | Admitting: Internal Medicine

## 2015-10-03 VITALS — BP 150/88 | HR 80 | Temp 97.2°F | Ht 68.0 in | Wt 182.4 lb

## 2015-10-03 DIAGNOSIS — K648 Other hemorrhoids: Secondary | ICD-10-CM

## 2015-10-03 NOTE — Progress Notes (Signed)
Primary Care Physician:  Shaddai Shapley Bellow, MD Primary Gastroenterologist:  Dr. Gala Romney  Pre-Procedure History & Physical: HPI:  Joshua Hodges is a 65 y.o. male here for follow-up.  GERD well controlled prilosec.  Hemorrhoidal sx have all settled down w Anusol and benefiber.  Past Medical History  Diagnosis Date  . Hypertension   . Hypercholesteremia   . GERD (gastroesophageal reflux disease)   . Hemorrhoids   . Rectal bleeding   . H/O colonoscopy   . Thyroid disease none in last 9 months    for 3 months, then took off medication  . Low testosterone   . Joint pain   . Prostate cancer ([facility])   . Tubular adenoma     Past Surgical History  Procedure Laterality Date  . Colonoscopy    . Colonoscopy N/A 10/27/2012    jenkins: 1. normal colon 2. internal hemorrhoids   . Vasectomy  1991  . Prostate biopsy    . Hernia repair  1960    at age 54  . Robot assisted laparoscopic radical prostatectomy N/A 06/01/2015    Procedure: ROBOTIC ASSISTED LAPAROSCOPIC RADICAL PROSTATECTOMY LEVEL 2;  Surgeon: Raynelle Bring, MD;  Location: [facility];  Service: Urology;  Laterality: N/A;  . Lymphadenectomy Bilateral 06/01/2015    Procedure: BILATERAL LYMPHADENECTOMY;  Surgeon: Raynelle Bring, MD;  Location: [facility];  Service: Urology;  Laterality: Bilateral;  . Colonoscopy N/A 08/11/2015    RMR: Friable anal canal hemorrhoids likely source of paper hematochezia. Single colon polyp removed as described above. CT abnormality suggested likely artifactual in origin.     Prior to Admission medications   Medication Sig Start Date End Date Taking? Authorizing Provider  amLODipine (NORVASC) 10 MG tablet Take 5 mg by mouth daily.  04/18/15  Yes Historical Provider, MD  atorvastatin (LIPITOR) 20 MG tablet Take 20 mg by mouth every morning.  04/24/15  Yes Historical Provider, MD  cholecalciferol (VITAMIN D) 1000 UNITS tablet Take 1,000 Units by mouth daily.   Yes Historical Provider, MD  clonazePAM (KLONOPIN) 1 MG  tablet Take 1 mg by mouth 2 (two) times daily as needed for anxiety.    Yes Historical Provider, MD  LORazepam (ATIVAN) 0.5 MG tablet Take 0.5 mg by mouth at bedtime as needed for sleep. Reported on 09/18/2015 04/24/15  Yes Historical Provider, MD  omeprazole (PRILOSEC OTC) 20 MG tablet Take 20 mg by mouth daily as needed (hearburn). Reported on 10/03/2015   Yes Historical Provider, MD  Calcium Carbonate-Simethicone (ALKA-SELTZER HEARTBURN + GAS) 750-80 MG CHEW Chew 1 tablet by mouth daily as needed (heartburn). Reported on 10/03/2015    Historical Provider, MD  hydrocortisone (ANUSOL-HC) 2.5 % rectal cream Place 1 application rectally 2 (two) times daily. Patient not taking: Reported on 10/03/2015 07/31/15   Carlis Stable, NP  polyethylene glycol-electrolytes (NULYTELY/GOLYTELY) 420 G solution Take 4,000 mLs by mouth once. Patient not taking: Reported on 09/18/2015 07/31/15   Carlis Stable, NP    Allergies as of 10/03/2015  . (No Known Allergies)    Family History  Problem Relation Age of Onset  . Colon cancer Neg Hx   . Multiple myeloma Father   . Cancer Paternal Uncle     kidney cancer    Social History   Social History  . Marital Status: Married    Spouse Name: N/A  . Number of Children: N/A  . Years of Education: N/A   Occupational History  . Not on file.   Social History Main  Topics  . Smoking status: Never Smoker   . Smokeless tobacco: Never Used  . Alcohol Use: No     Comment: none since 1985  . Drug Use: Yes    Special: Marijuana     Comment: none since 1985  . Sexual Activity: Yes   Other Topics Concern  . Not on file   Social History Narrative    Review of Systems: See HPI, otherwise negative ROS  Physical Exam: BP 150/88 mmHg  Pulse 80  Temp(Src) 97.2 F (36.2 C)  Ht 5' 8" (1.727 m)  Wt 182 lb 6.4 oz (82.736 kg)  BMI 27.74 kg/m2 General:   Alert,  Well-developed, well-nourished, pleasant and cooperative in NAD Skin:  Intact without significant lesions or  rashes. Neck:  Supple; no masses or thyromegaly. No significant cervical adenopathy. Lungs:  Clear throughout to auscultation.   No wheezes, crackles, or rhonchi. No acute distress. Heart:  Regular rate and rhythm; no murmurs, clicks, rubs,  or gallops. Abdomen: Non-distended, normal bowel sounds.  Soft and nontender without appreciable mass or hepatosplenomegaly.  Pulses:  Normal pulses noted. Extremities:  Without clubbing or edema.  Impression:  GERD well controlled n Nexium.  Hemorrhoids - now fairly asymptomatic with use of Anusol   Recommendations:   Avoid straining.  Benefiber 1 tablespoon twice daily  Limit toilet time to 2-5 minutes  Information on hemorrhoid banding and GERD  Call with any interim problems  Schedule followup appointment in May of this year.       Notice: This dictation was prepared with Dragon dictation along with smaller phrase technology. Any transcriptional errors that result from this process are unintentional and may not be corrected upon review. 

## 2015-10-03 NOTE — Patient Instructions (Signed)
Avoid straining.  Benefiber 1 tablespoon twice daily  Limit toilet time to 2-5 minutes  Information on hemorrhoid banding and GERD  Call with any interim problems  Schedule followup appointment in May of this year.

## 2015-12-19 ENCOUNTER — Encounter: Payer: Self-pay | Admitting: Internal Medicine

## 2016-02-28 ENCOUNTER — Other Ambulatory Visit (HOSPITAL_COMMUNITY): Payer: Self-pay | Admitting: Internal Medicine

## 2016-02-28 DIAGNOSIS — M7989 Other specified soft tissue disorders: Secondary | ICD-10-CM

## 2016-03-06 ENCOUNTER — Ambulatory Visit (HOSPITAL_COMMUNITY): Payer: 59

## 2016-03-08 ENCOUNTER — Ambulatory Visit (HOSPITAL_COMMUNITY)
Admission: RE | Admit: 2016-03-08 | Discharge: 2016-03-08 | Disposition: A | Discharge status: Home / Self Care | Payer: 59 | Source: Ambulatory Visit | Attending: Internal Medicine | Admitting: Internal Medicine

## 2016-03-08 DIAGNOSIS — M7989 Other specified soft tissue disorders: Secondary | ICD-10-CM | POA: Diagnosis not present

## 2016-07-05 ENCOUNTER — Ambulatory Visit (INDEPENDENT_AMBULATORY_CARE_PROVIDER_SITE_OTHER): Payer: BLUE CROSS/BLUE SHIELD | Admitting: Internal Medicine

## 2016-07-05 ENCOUNTER — Encounter: Payer: Self-pay | Admitting: Internal Medicine

## 2016-07-05 VITALS — BP 150/82 | HR 75 | Temp 97.1°F | Ht 68.0 in | Wt 189.6 lb

## 2016-07-05 DIAGNOSIS — K219 Gastro-esophageal reflux disease without esophagitis: Secondary | ICD-10-CM | POA: Diagnosis not present

## 2016-07-05 DIAGNOSIS — K921 Melena: Secondary | ICD-10-CM | POA: Diagnosis not present

## 2016-07-05 NOTE — Progress Notes (Signed)
Primary Care Physician:  Rokhaya Quinn Bellow, MD Primary Gastroenterologist:  Dr. Gala Romney  Pre-Procedure History & Physical: HPI:  Joshua Hodges is a 65 y.o. male here for GERD and hemorrhoids. States he's had 2 episodes of bleeding closely temporally associated with heavy lifting over the past 1 year. It clears up rapidly. He avoids straining. He is continuing to take Benefiber; he has started using Anusol cream recently as well. Otherwise, he is doing great. Omeprazole 20 mg daily is current keeping his reflux symptoms under control. Recent colonoscopy demonstrated adenoma which was recommended he return in 5 years for surveillance examination.  We have discussed banding previously but of whole held off because her symptoms are fairly infrequent.  Past Medical History:  Diagnosis Date  . GERD (gastroesophageal reflux disease)   . H/O colonoscopy   . Hemorrhoids   . Hypercholesteremia   . Hypertension   . Joint pain   . Low testosterone   . Prostate cancer ([place])   . Rectal bleeding   . Thyroid disease none in last 9 months   for 3 months, then took off medication  . Tubular adenoma     Past Surgical History:  Procedure Laterality Date  . COLONOSCOPY    . COLONOSCOPY N/A 10/27/2012   jenkins: 1. normal colon 2. internal hemorrhoids   . COLONOSCOPY N/A 08/11/2015   RMR: Friable anal canal hemorrhoids likely source of paper hematochezia. Single colon polyp removed as described above. CT abnormality suggested likely artifactual in origin.   Marland Kitchen HERNIA REPAIR  1960   at age 23  . LYMPHADENECTOMY Bilateral 06/01/2015   Procedure: BILATERAL LYMPHADENECTOMY;  Surgeon: Raynelle Bring, MD;  Location: [facility];  Service: Urology;  Laterality: Bilateral;  . PROSTATE BIOPSY    . ROBOT ASSISTED LAPAROSCOPIC RADICAL PROSTATECTOMY N/A 06/01/2015   Procedure: ROBOTIC ASSISTED LAPAROSCOPIC RADICAL PROSTATECTOMY LEVEL 2;  Surgeon: Raynelle Bring, MD;  Location: [facility];  Service: Urology;  Laterality: N/A;   . [place]    Prior to Admission medications   Medication Sig Start Date End Date Taking? Authorizing Provider  amLODipine (NORVASC) 10 MG tablet Take 5 mg by mouth daily.  04/18/15  Yes Historical Provider, MD  atorvastatin (LIPITOR) 20 MG tablet Take 20 mg by mouth every morning.  04/24/15  Yes Historical Provider, MD  cholecalciferol (VITAMIN D) 1000 UNITS tablet Take 1,000 Units by mouth daily.   Yes Historical Provider, MD  hydrocortisone (ANUSOL-HC) 2.5 % rectal cream Place 1 application rectally 2 (two) times daily. 07/31/15  Yes Carlis Stable, NP  LORazepam (ATIVAN) 0.5 MG tablet Take 0.5 mg by mouth at bedtime as needed for sleep. Reported on 09/18/2015 04/24/15  Yes Historical Provider, MD  Multiple Vitamin (MULTIVITAMIN) capsule Take 1 capsule by mouth daily.   Yes Historical Provider, MD  omeprazole (PRILOSEC OTC) 20 MG tablet Take 20 mg by mouth daily as needed (hearburn). Reported on 10/03/2015   Yes Historical Provider, MD  Wheat Dextrin (BENEFIBER DRINK MIX PO) Take by mouth daily.   Yes Historical Provider, MD  Calcium Carbonate-Simethicone (ALKA-SELTZER HEARTBURN + GAS) 750-80 MG CHEW Chew 1 tablet by mouth daily as needed (heartburn). Reported on 10/03/2015    Historical Provider, MD  clonazePAM (KLONOPIN) 1 MG tablet Take 1 mg by mouth 2 (two) times daily as needed for anxiety.     Historical Provider, MD  polyethylene glycol-electrolytes (NULYTELY/GOLYTELY) 420 G solution Take 4,000 mLs by mouth once. Patient not taking: Reported on 07/05/2016 07/31/15  Carlis Stable, NP    Allergies as of 07/05/2016  . (No Known Allergies)    Family History  Problem Relation Age of Onset  . Colon cancer Neg Hx   . Multiple myeloma Father   . Cancer Paternal Uncle     kidney cancer    Social History   Social History  . Marital status: Married    Spouse name: N/A  . Number of children: N/A  . Years of education: N/A   Occupational History  . Not on file.   Social History  Main Topics  . Smoking status: Never Smoker  . Smokeless tobacco: Never Used  . Alcohol use No     Comment: none since 1985  . Drug use:     Types: Marijuana     Comment: none since 1985  . Sexual activity: Yes   Other Topics Concern  . Not on file   Social History Narrative  . No narrative on file    Review of Systems: See HPI, otherwise negative ROS  Physical Exam: BP (!) 150/82   Pulse 75   Temp 97.1 F (36.2 C) (Oral)   Ht 5' 8"  (1.727 m)   Wt 189 lb 9.6 oz (86 kg)   BMI 28.83 kg/m  General:   Alert,  Well-developed, well-nourished, pleasant and cooperative in NAD   Impression:  Pleasant 65 year old gentleman with history of GERD well controlled on omeprazole 20 mg daily. Paper hematochezia in the setting of straining; (2) episodes in the past 1 year. Known friable internal hemorrhoids. We again discussed the multipronged approach to hemorrhoids and discussed the option of hemorrhoid banding. Treatment of asymptomatic hemorrhoids not recommended; However, I suggested if he has recurrent bleeding it would be worthwhile to embark on banding, understanding 100% guarantee could not be issued regarding complete resolution of symptoms with this treatment modality.   Recommendations:   Continue omeprazole daily  Avoid straining.  Benefiber 2 teaspoons twice daily  Limit toilet time to 5 minutes  Call with any interim problems  Pamphlet on hemorrhoid banding  Office visit in 1 year  Surveillance colonoscopy 4 years from now.  If recurrent hemorrhoid symptoms, call us and we will set up banding      Notice: This dictation was prepared with Dragon dictation along with smaller phrase technology. Any transcriptional errors that result from this process are unintentional and may not be corrected upon review.

## 2016-07-05 NOTE — Progress Notes (Signed)
Continue omeprazole daily  Avoid straining.  Benefiber 2 teaspoons twice daily  Limit toilet time to 5 minutes  Call with any interim problems  Pamphlet on hemorrhoid banding  Office visit in 1 year  If recurrent hemorrhoid symptoms, call us and we will set up banding

## 2016-07-05 NOTE — Patient Instructions (Signed)
Continue omeprazole daily  Avoid straining.  Benefiber 2 teaspoons twice daily  Limit toilet time to 5 minutes  Call with any interim problems  Pamphlet on hemorrhoid banding  Office visit in 1 year  If recurrent hemorrhoid symptoms, call us and we will set up banding

## 2016-09-14 IMAGING — MR MR ABDOMEN WO/W CM
10 of 19 series · 21 of 48 positions shown · IV contrast (multihance)
Comparison: CT 04/07/2015

CLINICAL DATA: Follow-up indeterminate RIGHT renal lesion. History
prostate cancer.

EXAM:
MRI ABDOMEN WITHOUT AND WITH CONTRAST
TECHNIQUE: Multiplanar multisequence MR imaging of the abdomen was performed
both before and after the administration of intravenous contrast.
CONTRAST:  16mL MULTIHANCE GADOBENATE DIMEGLUMINE 529 MG/ML IV SOLN

[Series 3: DWI b500 · axial · 6.0mm · 1.48mm/px · z∈[-102,+124]mm · 3 of 60 slices shown]
[im 1/60]
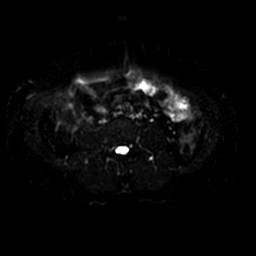
[im 30/60]
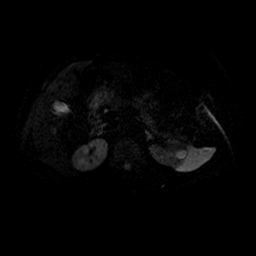
[im 60/60]
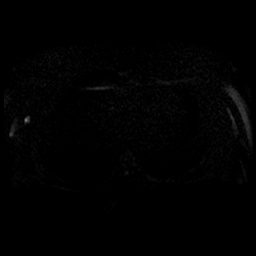

[Series 4: T2 fat-sat · axial · 6.0mm · 0.74mm/px · 1 of 33 slices shown]
[im 1/33]
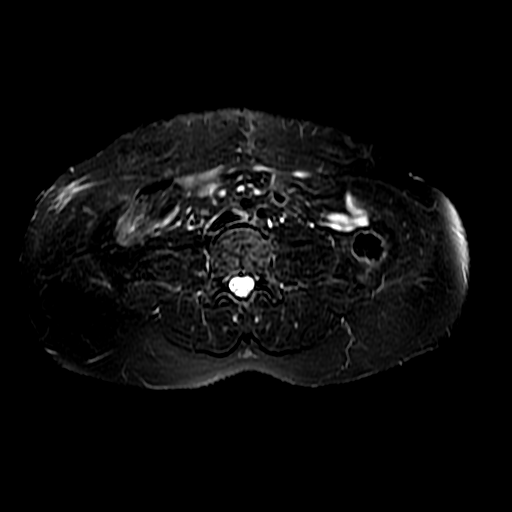

[Series 7: ax dualecho · axial · 5.0mm · 0.78mm/px · z∈[-135,+85]mm · 3 of 90 slices shown]
[im 1/90]
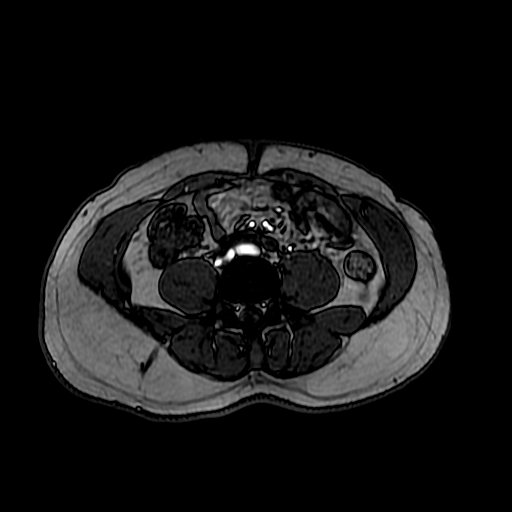
[im 45/90]
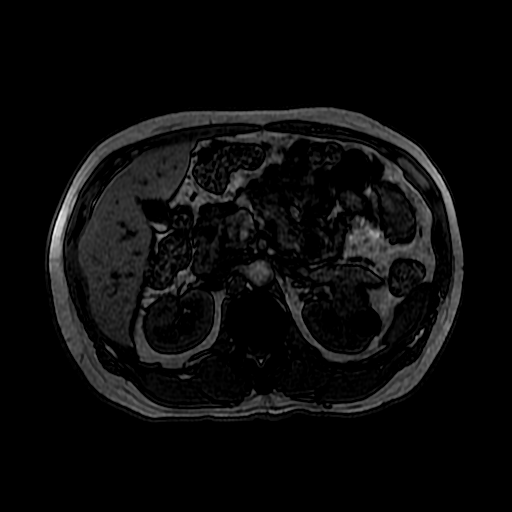
[im 90/90]
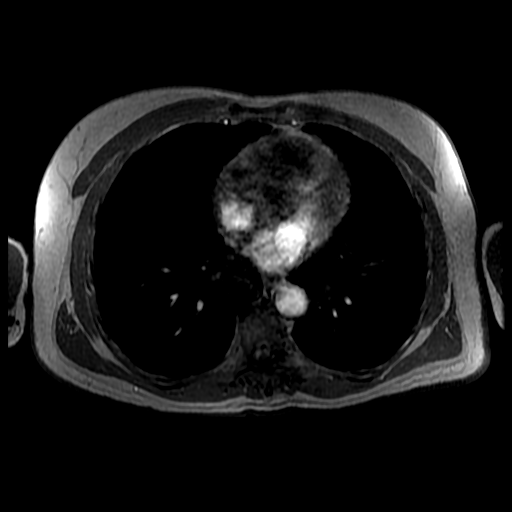

[Series 8: T2 · axial · 5.0mm · 0.78mm/px · z∈[-135,+85]mm · 2 of 45 slices shown (1 of 2)]
[im 1/45]
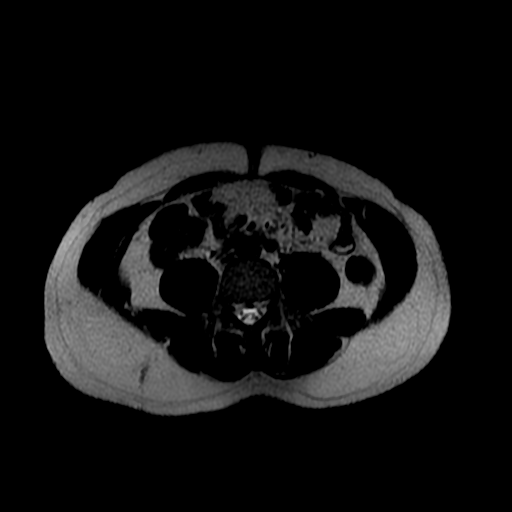
[im 45/45]
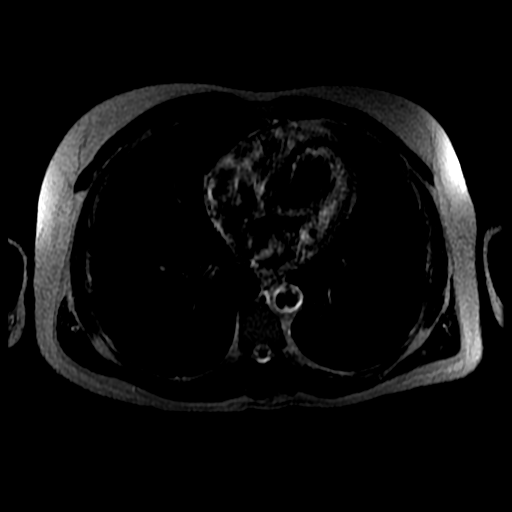

[Series 9: bSSFP · axial · 5.0mm · 0.78mm/px · 1 of 36 slices shown (1 of 2)]
[im 1/36]
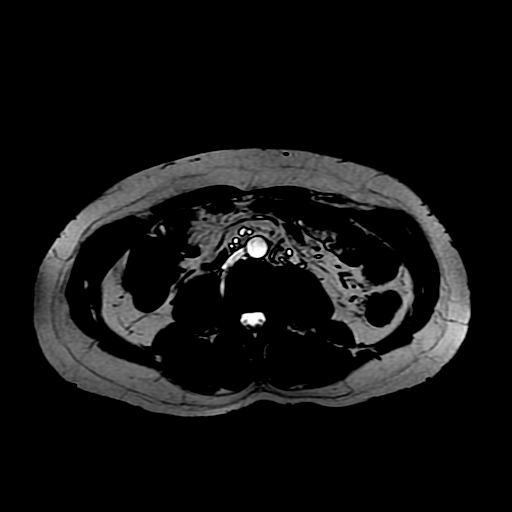

[Series 10: bSSFP · axial · 5.0mm · 0.78mm/px · z∈[-135,+85]mm · 2 of 45 slices shown (2 of 2)]
[im 1/45]
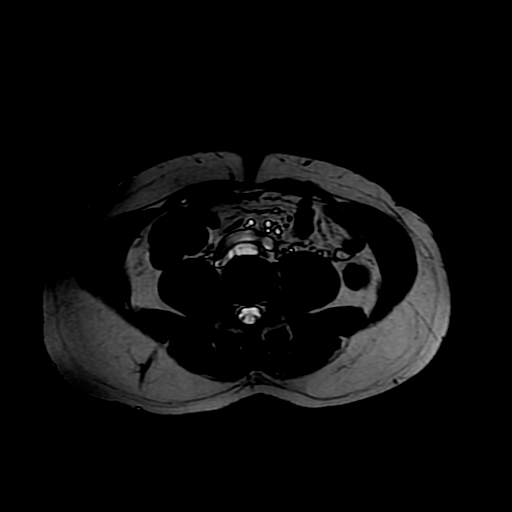
[im 45/45]
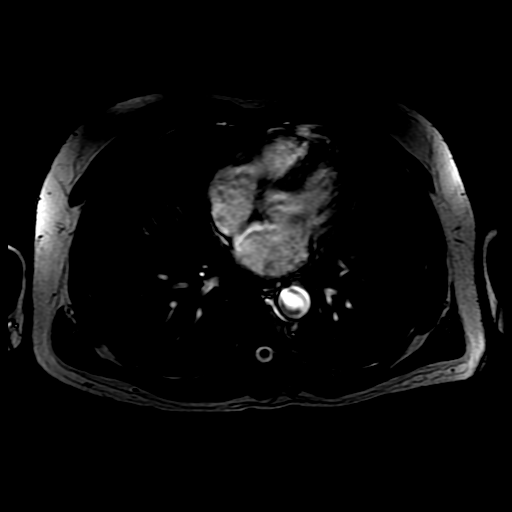

[Series 11: T2 · coronal · 5.0mm · 0.78mm/px · 2 of 44 slices shown (2 of 2)]
[im 1/44]
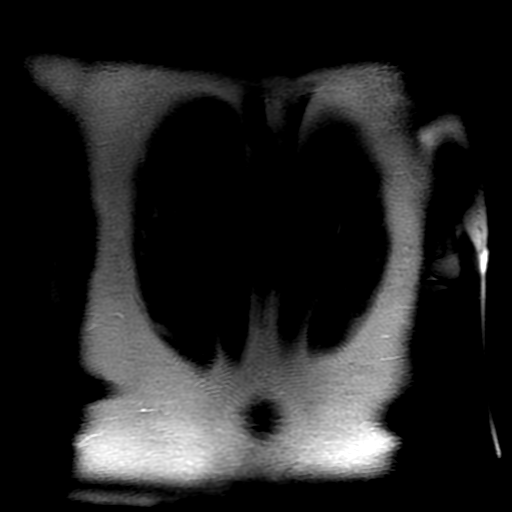
[im 44/44]
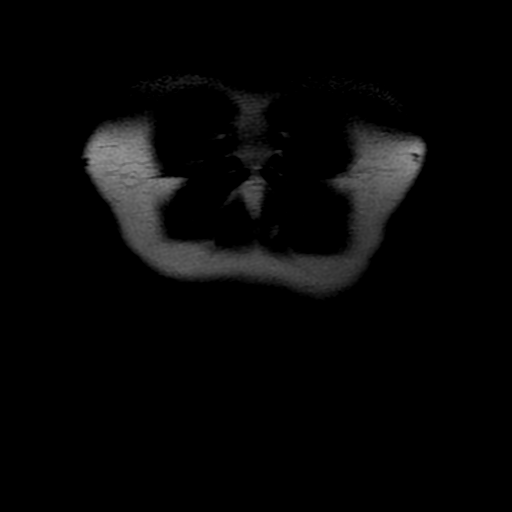

[Series 300: DWI · axial · 6.0mm · 1.48mm/px · 1 of 30 slices shown]
[im 1/30]
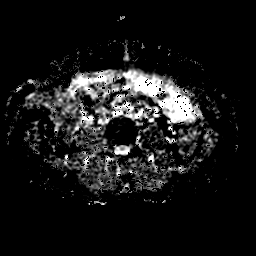

[Series 1200: T1 dynamic · axial · 5.0mm · 0.78mm/px · z∈[-139,+79]mm · 3 of 88 slices shown (1 of 2)]
[im 1/88]
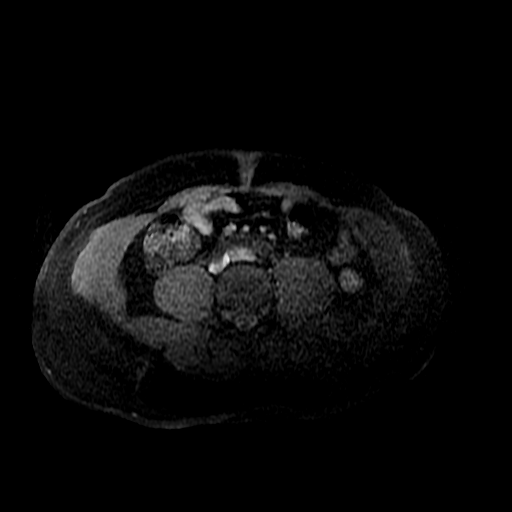
[im 44/88]
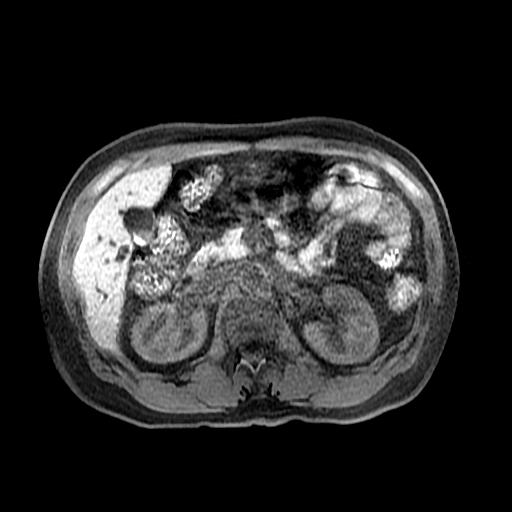
[im 88/88]
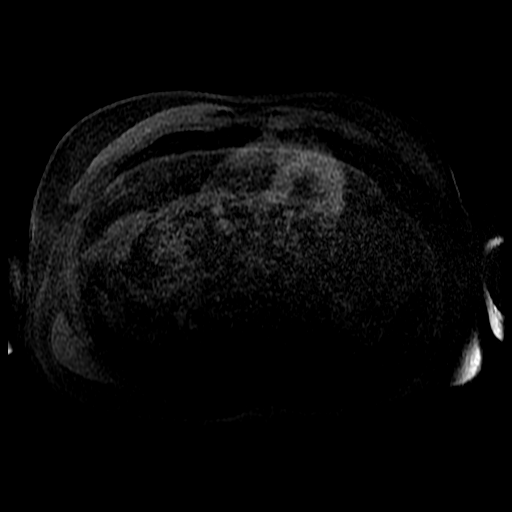

[Series 1201: T1 dynamic · axial · 5.0mm · 0.78mm/px · z∈[-139,+79]mm · 3 of 88 slices shown (2 of 2)]
[im 1/88]
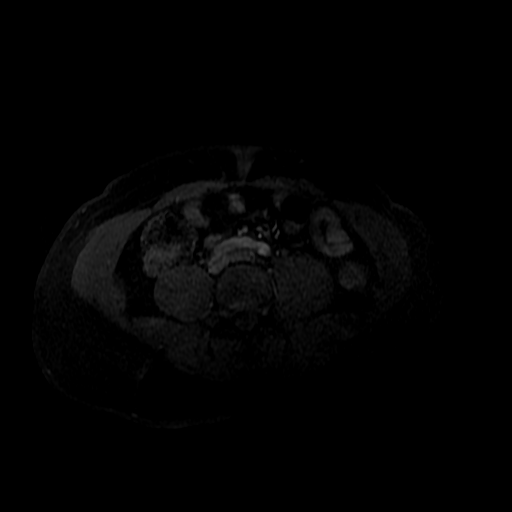
[im 44/88]
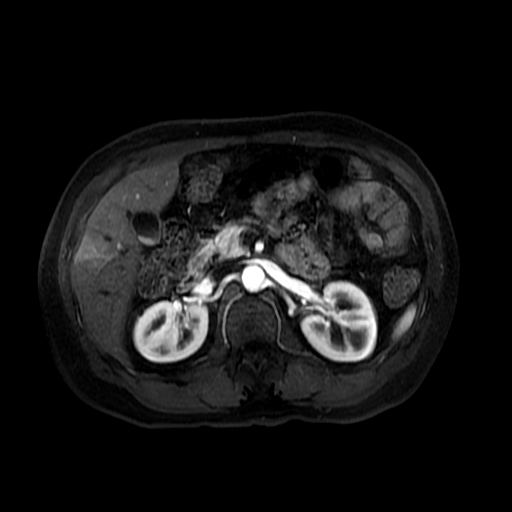
[im 88/88]
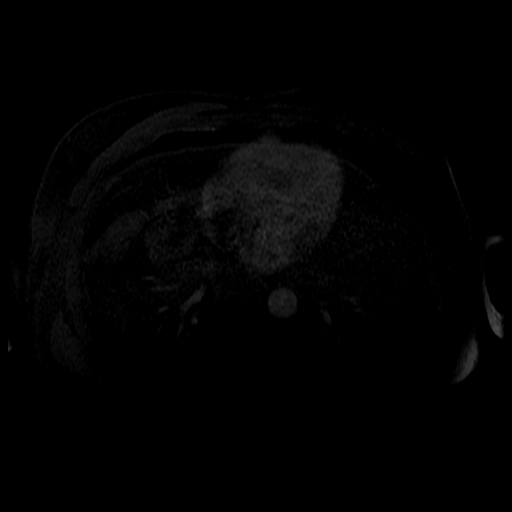

[21 of 48 positions shown; findings below may reference images not displayed]

FINDINGS: Lower chest:  Lung bases are clear.

Hepatobiliary: There is nonenhancing cystic lesion inferior RIGHT
hepatic lobe on image 27, series 8 consistent benign hepatic cysts.
No biliary duct dilatation. Similar small cysts in the LEFT lateral
hepatic lobe. Gallbladder contains multiple small round gallstones
measuring approximately 3 mm each. No gallbladder inflammation.
Common bile duct normal caliber.

Pancreas: Normal pancreatic parenchymal intensity. No ductal
dilatation or inflammation.

Spleen: Normal spleen.

Adrenals/urinary tract: Adrenal glands normal.

Lesion of concern lower pole of the RIGHT kidney measures 7 mm on
image 68, series 2300 which is not changed in size from comparison
CT 04/07/2015. Lesion has high signal intensity on T1 weighted
imaging consistent with internal blood product (series 2300).
Difficult to measure post-contrast enhancement directly due to very
small size of lesion however on the post-contrast subtraction series
(series 44564 through 55898) there is good evidence for no
enhancement.

There is atypical nonenhancing cysts within the LEFT kidney
measuring 23 mm.

Stomach/Bowel: Stomach and limited of the small bowel is
unremarkable. Of note there is no mass lesion in the hepatic flexure
the colon which within area of concern on comparison CT

Vascular/Lymphatic: Abdominal aortic normal caliber. No
retroperitoneal periportal lymphadenopathy.

Musculoskeletal: No aggressive osseous lesion
IMPRESSION: 1. While the lesion in the lower pole of the RIGHT kidney difficult
to characterize due to small size favor a nonenhancing hemorrhagic
cyst (Bosniak 2). No change in size.
2. Typical Bosniak 1 renal cyst LEFT kidney.
3. Cholelithiasis without cholecystitis.

## 2017-02-04 ENCOUNTER — Other Ambulatory Visit (HOSPITAL_COMMUNITY): Payer: Self-pay | Admitting: Urology

## 2017-02-04 DIAGNOSIS — D49511 Neoplasm of unspecified behavior of right kidney: Secondary | ICD-10-CM

## 2017-03-07 ENCOUNTER — Ambulatory Visit (HOSPITAL_COMMUNITY)
Admission: RE | Admit: 2017-03-07 | Discharge: 2017-03-07 | Disposition: A | Discharge status: Home / Self Care | Payer: BLUE CROSS/BLUE SHIELD | Source: Ambulatory Visit | Attending: Urology | Admitting: Urology

## 2017-03-07 DIAGNOSIS — D49511 Neoplasm of unspecified behavior of right kidney: Secondary | ICD-10-CM | POA: Diagnosis present

## 2017-03-07 DIAGNOSIS — K8020 Calculus of gallbladder without cholecystitis without obstruction: Secondary | ICD-10-CM | POA: Diagnosis not present

## 2017-03-07 LAB — POCT I-STAT CREATININE: Creatinine, Ser: 1 mg/dL (ref 0.61–1.24)

## 2017-03-07 MED ORDER — GADOBENATE DIMEGLUMINE 529 MG/ML IV SOLN
20.0000 mL | Freq: Once | INTRAVENOUS | Status: AC | PRN
  Administered 2017-03-07: 18 mL via INTRAVENOUS

## 2017-06-06 ENCOUNTER — Encounter: Payer: Self-pay | Admitting: Internal Medicine

## 2017-07-08 ENCOUNTER — Encounter: Payer: Self-pay | Admitting: Internal Medicine

## 2017-07-08 ENCOUNTER — Ambulatory Visit: Payer: BLUE CROSS/BLUE SHIELD | Admitting: Internal Medicine

## 2017-07-08 VITALS — BP 154/93 | HR 88 | Temp 97.5°F | Ht 68.0 in | Wt 192.8 lb

## 2017-07-08 DIAGNOSIS — K648 Other hemorrhoids: Secondary | ICD-10-CM | POA: Diagnosis not present

## 2017-07-08 NOTE — Patient Instructions (Signed)
Avoid straining.  Benefiber 2 tablespoon daily  Limit toilet time to 5 minutes  Call with any interim problems  Schedule followup appointment in 4 weeks from now

## 2017-07-08 NOTE — Progress Notes (Signed)
[place] banding procedure note:  The patient presents with symptomatic grade 2/3 hemorrhoids, unresponsive to maximal medical therapy.  Pt requesting , requesting rubber band ligation of his hemorrhoidal disease. All risks, benefits, and alternative forms of therapy were described and informed consent was obtained.  In the left lateral decubitus position, a DRE utilizing topical nitroglycerin Xylocaine used as lubricant performed. No abnormalities appreciated.  Anoscopic examination revealed a prominent right anterior hemorrhoid column. The decision was made to band the right anterior internal hemorrhoid;  the [place] was used to perform band ligation without complication. Digital anorectal examination was then performed to assure proper positioning of the band and to adjust the banded tissue as required. Band found to be in excellent position with no pinching or pain.  The patient was discharged home without pain or other issues. Dietary and behavioral recommendations were given.  The patient will return in 4 weeks for followup and possible additional banding as required.  No complications were encountered and the patient tolerated the procedure well.

## 2017-08-08 ENCOUNTER — Ambulatory Visit: Payer: BLUE CROSS/BLUE SHIELD | Admitting: Internal Medicine

## 2017-08-08 ENCOUNTER — Encounter: Payer: Self-pay | Admitting: Internal Medicine

## 2017-08-08 VITALS — BP 126/77 | HR 78 | Temp 97.0°F | Ht 68.0 in | Wt 190.2 lb

## 2017-08-08 DIAGNOSIS — K648 Other hemorrhoids: Secondary | ICD-10-CM

## 2017-08-08 NOTE — Patient Instructions (Signed)
Avoid straining.  Benefiber 1 tablespoon twice daily  Limit toilet time to 5 minutes  Call with any interim problems  Schedule followup appointment in 3 months from now  As discussed, the benefits of taking omeprazole 20 mg daily versus every other day outweigh the risks.

## 2017-08-08 NOTE — Progress Notes (Signed)
  [place] banding procedure note:  The patient presents with symptomatic grade 2/3 hemorrhoids; status post banding of the right anterior hemorrhoid column previously. He's done well. He thinks it's helped. He wishes to have additional banding done today. The risks, benefits, limitations and alternatives have been reviewed. A DRE utilizing nitroglycerin and Xylocaine ointment as lubricant reveal no abnormalities. The decision was made to band the right posterior internal hemorrhoid;  the [place] was used to perform band ligation without complication. Digital anorectal examination was then performed to assure proper positioning of the band and to adjust the banded tissue as required. Band found to be in excellent position. No pinching or pain. I elected to place a left lateral band subsequently in similar fashion. Excellent position on follow-up DRE. No pinching or pain.. Patient was discharged home without pain or other issues. Dietary and behavioral recommendations were given along with follow-up instructions. The patient will return in 3 months for followup and possible additional banding as required.  No complications were encountered and the patient tolerated the procedure well.  As far as reflux is concerned, patient stopped omeprazole -  Alka-Seltzer and Pepcid are not effective.  He has frequent reflux symptoms. Concerned about dementia risk. We discussed what is known in the limited scientific literature regarding this question. In his situation, I feel the benefits outweigh the risks. In fact,  he's taken omeprazole 20 mg every other day with good control of symptoms. I recommended he do that as the benefits appear to outweigh the risks.

## 2017-09-30 ENCOUNTER — Encounter: Payer: Self-pay | Admitting: Internal Medicine

## 2017-11-14 ENCOUNTER — Ambulatory Visit: Payer: BLUE CROSS/BLUE SHIELD | Admitting: Internal Medicine

## 2017-11-14 ENCOUNTER — Encounter: Payer: Self-pay | Admitting: Internal Medicine

## 2017-11-14 VITALS — BP 134/90 | HR 95 | Temp 96.8°F | Ht 68.0 in | Wt 187.4 lb

## 2017-11-14 DIAGNOSIS — K648 Other hemorrhoids: Secondary | ICD-10-CM | POA: Diagnosis not present

## 2017-11-14 DIAGNOSIS — K219 Gastro-esophageal reflux disease without esophagitis: Secondary | ICD-10-CM

## 2017-11-14 DIAGNOSIS — Z86010 Personal history of colonic polyps: Secondary | ICD-10-CM | POA: Diagnosis not present

## 2017-11-14 NOTE — Patient Instructions (Signed)
Continue daily fiber; may gradually increase Benefiber to 2 tablespoon daily - do not take within 2 hours of other medications.  GERD information provided  May continue Omeperazole and antacids as needed  Office visit in 1 year  Surveillance colonoscopy in 2021

## 2017-11-14 NOTE — Progress Notes (Signed)
Primary Care Physician:  Lemmie Evens, MD Primary Gastroenterologist:  Dr. Gala Romney  Pre-Procedure History & Physical: HPI:  Joshua Hodges is a 67 y.o. male here for follow-up symptomatic hemorrhoids/GERD. Patient's been banded multiplications. He states that the banding has been associated with complete resolution of his symptoms. He is having no more hemorrhoid symptoms. Very happy.  Takes one omeprazole weekly and occasional antacids. He has states he's figured out brought in his diet triggers reflux and has modified that as well. Overall, doing quite well.  History of colonic adenoma; due for surveillance colonoscopy 2021.  Past Medical History:  Diagnosis Date  . GERD (gastroesophageal reflux disease)   . H/O colonoscopy   . Hemorrhoids   . Hypercholesteremia   . Hypertension   . Joint pain   . Low testosterone   . Prostate cancer ([place])   . Rectal bleeding   . Thyroid disease none in last 9 months   for 3 months, then took off medication  . Tubular adenoma     Past Surgical History:  Procedure Laterality Date  . COLONOSCOPY    . COLONOSCOPY N/A 10/27/2012   jenkins: 1. normal colon 2. internal hemorrhoids   . COLONOSCOPY N/A 08/11/2015   RMR: Friable anal canal hemorrhoids likely source of paper hematochezia. Single colon polyp removed as described above. CT abnormality suggested likely artifactual in origin.   Marland Kitchen HERNIA REPAIR  1960   at age 67  . LYMPHADENECTOMY Bilateral 06/01/2015   Procedure: BILATERAL LYMPHADENECTOMY;  Surgeon: Raynelle Bring, MD;  Location: [facility];  Service: Urology;  Laterality: Bilateral;  . PROSTATE BIOPSY    . ROBOT ASSISTED LAPAROSCOPIC RADICAL PROSTATECTOMY N/A 06/01/2015   Procedure: ROBOTIC ASSISTED LAPAROSCOPIC RADICAL PROSTATECTOMY LEVEL 2;  Surgeon: Raynelle Bring, MD;  Location: [facility];  Service: Urology;  Laterality: N/A;  . [place]    Prior to Admission medications   Medication Sig Start Date End Date Taking? Authorizing  Provider  amLODipine (NORVASC) 10 MG tablet Take 5 mg by mouth daily.  04/18/15  Yes [provider]  atorvastatin (LIPITOR) 20 MG tablet Take 20 mg by mouth every morning.  04/24/15  Yes [provider]  Calcium Carbonate-Simethicone (ALKA-SELTZER HEARTBURN + GAS) 750-80 MG CHEW Chew 1 tablet by mouth daily as needed (heartburn). Reported on 10/03/2015   Yes [provider]  cholecalciferol (VITAMIN D) 1000 UNITS tablet Take 1,000 Units by mouth daily.   Yes [provider]  clonazePAM (KLONOPIN) 1 MG tablet Take 1 mg by mouth 2 (two) times daily as needed for anxiety.    Yes [provider]  LORazepam (ATIVAN) 0.5 MG tablet Take 0.5 mg by mouth at bedtime as needed for sleep. Reported on 09/18/2015 04/24/15  Yes [provider]  Multiple Vitamin (MULTIVITAMIN) capsule Take 1 capsule by mouth daily.   Yes [provider]  omeprazole (PRILOSEC OTC) 20 MG tablet Take 20 mg by mouth daily as needed (hearburn). Reported on 10/03/2015   Yes [provider]  Wheat Dextrin (BENEFIBER DRINK MIX PO) Take daily by mouth. 2 tbsp daily   Yes [provider]  famotidine (PEPCID) 10 MG tablet Take 10 mg 2 (two) times daily by mouth.    [provider]  hydrocortisone (ANUSOL-HC) 2.5 % rectal cream Place 1 application rectally 2 (two) times daily. Patient not taking: Reported on 07/08/2017 07/31/15   Carlis Stable, NP  polyethylene glycol-electrolytes (NULYTELY/GOLYTELY) 420 G solution Take 4,000 mLs by mouth once. Patient  not taking: Reported on 07/05/2016 07/31/15   Carlis Stable, NP    Allergies as of 11/14/2017  . (No Known Allergies)    Family History  Problem Relation Age of Onset  . Colon cancer Neg Hx   . Multiple myeloma Father   . Cancer Paternal Uncle        kidney cancer    Social History   Socioeconomic History  . Marital status: Married    Spouse name: Not on file  . Number of children: Not on file  .  Years of education: Not on file  . Highest education level: Not on file  Social Needs  . Financial resource strain: Not on file  . Food insecurity - worry: Not on file  . Food insecurity - inability: Not on file  . Transportation needs - medical: Not on file  . Transportation needs - non-medical: Not on file  Occupational History  . Not on file  Tobacco Use  . Smoking status: Never Smoker  . Smokeless tobacco: Never Used  Substance and Sexual Activity  . Alcohol use: No    Comment: none since 1985  . Drug use: No    Comment: none since 1985  . Sexual activity: Yes  Other Topics Concern  . Not on file  Social History Narrative  . Not on file    Review of Systems: See HPI, otherwise negative ROS  Physical Exam: BP 134/90   Pulse 95   Temp (!) 96.8 F (36 C) (Oral)   Ht 5' 8"  (1.727 m)   Wt 187 lb 6.4 oz (85 kg)   BMI 28.49 kg/m  General:   Alert,  Well-developed, well-nourished, pleasant and cooperative in NAD Detailed exam deferred  Impression:  Pleasant 67 year old symptomatic hemorrhoids appears got an excellent response with banding. I stressed good toilet habits and ongoing fiber supplementation with the patient. He may or may may not have recurrent symptomatic hemorrhoids in the future.  Reflux symptoms well controlled on his novel regimen. He continues to be fearful of side effects of long-term PPI therapy. Certainly, no alarm symptoms at this time.  Recommendations:  Continue daily fiber; may gradually increase Benefiber to 2 tablespoon daily - do not take within 2 hours of other medications.  GERD information provided  May continue Omeperazole and antacids as needed  Office visit in 1 year  Surveillance colonoscopy in 2021      Notice: This dictation was prepared with Dragon dictation along with smaller phrase technology. Any transcriptional errors that result from this process are unintentional and may not be corrected upon review.

## 2018-03-18 ENCOUNTER — Other Ambulatory Visit: Payer: Self-pay | Admitting: Urology

## 2018-03-18 DIAGNOSIS — D49511 Neoplasm of unspecified behavior of right kidney: Secondary | ICD-10-CM

## 2018-03-27 ENCOUNTER — Ambulatory Visit
Admission: RE | Admit: 2018-03-27 | Discharge: 2018-03-27 | Disposition: A | Discharge status: Home / Self Care | Payer: Medicare Other | Source: Ambulatory Visit | Attending: Urology | Admitting: Urology

## 2018-03-27 DIAGNOSIS — D49511 Neoplasm of unspecified behavior of right kidney: Secondary | ICD-10-CM

## 2018-03-27 MED ORDER — GADOBENATE DIMEGLUMINE 529 MG/ML IV SOLN
18.0000 mL | Freq: Once | INTRAVENOUS | Status: AC | PRN
  Administered 2018-03-27: 18 mL via INTRAVENOUS

## 2018-05-08 ENCOUNTER — Ambulatory Visit: Payer: Medicare Other | Admitting: Internal Medicine

## 2018-05-08 ENCOUNTER — Encounter

## 2020-06-01 ENCOUNTER — Other Ambulatory Visit (HOSPITAL_COMMUNITY): Payer: Self-pay | Admitting: Urology

## 2020-06-01 DIAGNOSIS — D49511 Neoplasm of unspecified behavior of right kidney: Secondary | ICD-10-CM

## 2020-06-09 ENCOUNTER — Ambulatory Visit (HOSPITAL_COMMUNITY): Payer: Medicare Other

## 2020-06-21 ENCOUNTER — Other Ambulatory Visit: Payer: Self-pay

## 2020-06-21 ENCOUNTER — Ambulatory Visit (HOSPITAL_COMMUNITY)
Admission: RE | Admit: 2020-06-21 | Discharge: 2020-06-21 | Disposition: A | Discharge status: Home / Self Care | Payer: Medicare Other | Source: Ambulatory Visit | Attending: Urology | Admitting: Urology

## 2020-06-21 DIAGNOSIS — D49511 Neoplasm of unspecified behavior of right kidney: Secondary | ICD-10-CM | POA: Diagnosis present

## 2020-06-21 MED ORDER — GADOBUTROL 1 MMOL/ML IV SOLN
9.0000 mL | Freq: Once | INTRAVENOUS | Status: AC | PRN
  Administered 2020-06-21: 9 mL via INTRAVENOUS

## 2020-08-01 ENCOUNTER — Encounter: Payer: Self-pay | Admitting: Internal Medicine

## 2020-09-22 ENCOUNTER — Telehealth: Payer: Medicare Other | Admitting: Gastroenterology

## 2020-11-01 ENCOUNTER — Encounter: Payer: Self-pay | Admitting: *Deleted

## 2020-11-01 ENCOUNTER — Ambulatory Visit: Payer: Medicare Other | Admitting: Gastroenterology

## 2020-11-01 ENCOUNTER — Encounter: Payer: Self-pay | Admitting: Gastroenterology

## 2020-11-01 ENCOUNTER — Other Ambulatory Visit: Payer: Self-pay

## 2020-11-01 VITALS — BP 124/78 | HR 87 | Temp 97.8°F | Ht 68.0 in | Wt 186.2 lb

## 2020-11-01 DIAGNOSIS — K649 Unspecified hemorrhoids: Secondary | ICD-10-CM | POA: Diagnosis not present

## 2020-11-01 DIAGNOSIS — Z86010 Personal history of colonic polyps: Secondary | ICD-10-CM | POA: Diagnosis not present

## 2020-11-01 MED ORDER — CLENPIQ 10-3.5-12 MG-GM -GM/160ML PO SOLN: 1.0000 | Freq: Once | ORAL | 0 refills | Status: AC

## 2020-11-01 MED ORDER — HYDROCORTISONE (PERIANAL) 2.5 % EX CREA: 1.0000 "application " | TOPICAL_CREAM | Freq: Two times a day (BID) | CUTANEOUS | 1 refills | Status: DC

## 2020-11-01 NOTE — Progress Notes (Signed)
Primary Care Physician:  Abran Richard, MD  Primary Gastroenterologist:  Garfield Cornea, MD   Chief Complaint  Patient presents with  . Colonoscopy    C/o hemorrhoids burning/itching    HPI:  Kasen Adduci is a 70 y.o. male with history of adenomatous colon polyps presents to schedule surveillance colonoscopy. Last colonoscopy 08/2015.   He has been doing well with exception of hemorrhoid flare which starting couple of weeks ago after some heavy lifting. Patient has underwent hemorrhoid banding several years ago. No rectal bleeding. Has some anal itching. BMs regular. No straining. Also with chronic GERD. Tries to wait at least four hours after meals before laying down. If he wakes up with reflux, generally will try peppermint and water or pepcid. Certain trigger foods.No dysphagia, abdominal pain, vomiting. Complains of a lot of gas. Denies associated pain.    Current Outpatient Medications  Medication Sig Dispense Refill  . amLODipine (NORVASC) 10 MG tablet Take 10 mg by mouth daily.    Marland Kitchen atorvastatin (LIPITOR) 20 MG tablet Take 20 mg by mouth every morning.     . cholecalciferol (VITAMIN D) 1000 UNITS tablet Take 1,000 Units by mouth daily.    . famotidine (PEPCID) 20 MG tablet Take 20 mg by mouth daily. As needed    . fluticasone (FLONASE) 50 MCG/ACT nasal spray Place 1-2 sprays into both nostrils daily. As needed    . Multiple Vitamin (MULTIVITAMIN) capsule Take 1 capsule by mouth daily.    . traZODone (DESYREL) 50 MG tablet Take 50 mg by mouth at bedtime.    . Wheat Dextrin (BENEFIBER DRINK MIX PO) Take by mouth daily. 4 grams per day     No current facility-administered medications for this visit.    Allergies as of 11/01/2020  . (No Known Allergies)    Past Medical History:  Diagnosis Date  . GERD (gastroesophageal reflux disease)   . H/O colonoscopy   . Hemorrhoids   . Hypercholesteremia   . Hypertension   . Joint pain   . Low testosterone   . Prostate cancer  ([place])   . Rectal bleeding   . Thyroid disease none in last 9 months   for 3 months, then took off medication  . Tubular adenoma     Past Surgical History:  Procedure Laterality Date  . COLONOSCOPY    . COLONOSCOPY N/A 10/27/2012   jenkins: 1. normal colon 2. internal hemorrhoids   . COLONOSCOPY N/A 08/11/2015   RMR: Friable anal canal hemorrhoids likely source of paper hematochezia. Single colon polyp removed as described above. CT abnormality suggested likely artifactual in origin.   Marland Kitchen HERNIA REPAIR  1960   at age 65  . LYMPHADENECTOMY Bilateral 06/01/2015   Procedure: BILATERAL LYMPHADENECTOMY;  Surgeon: Raynelle Bring, MD;  Location: [facility];  Service: Urology;  Laterality: Bilateral;  . PROSTATE BIOPSY    . ROBOT ASSISTED LAPAROSCOPIC RADICAL PROSTATECTOMY N/A 06/01/2015   Procedure: ROBOTIC ASSISTED LAPAROSCOPIC RADICAL PROSTATECTOMY LEVEL 2;  Surgeon: Raynelle Bring, MD;  Location: [facility];  Service: Urology;  Laterality: N/A;  . VASECTOMY  1991    Family History  Problem Relation Age of Onset  . Colon cancer Neg Hx   . Multiple myeloma Father   . Cancer Paternal Uncle        kidney cancer    Social History   Socioeconomic History  . Marital status: Married    Spouse name: Not on file  . Number of children: Not on file  . Years  of education: Not on file  . Highest education level: Not on file  Occupational History  . Not on file  Tobacco Use  . Smoking status: Never Smoker  . Smokeless tobacco: Never Used  Substance and Sexual Activity  . Alcohol use: No    Comment: none since 1985  . Drug use: No    Types: Marijuana    Comment: none since 1985  . Sexual activity: Yes  Other Topics Concern  . Not on file  Social History Narrative  . Not on file   Social Determinants of Health   Financial Resource Strain: Not on file  Food Insecurity: Not on file  Transportation Needs: Not on file  Physical Activity: Not on file  Stress: Not on file  Social Connections: Not  on file  Intimate Partner Violence: Not on file      ROS:  General: Negative for anorexia, weight loss, fever, chills, fatigue, weakness. Eyes: Negative for vision changes.  ENT: Negative for hoarseness, difficulty swallowing , nasal congestion. CV: Negative for chest pain, angina, palpitations, dyspnea on exertion, peripheral edema.  Respiratory: Negative for dyspnea at rest, dyspnea on exertion, cough, sputum, wheezing.  GI: See history of present illness. GU:  Negative for dysuria, hematuria, urinary incontinence, urinary frequency, nocturnal urination.  MS: Negative for joint pain, low back pain.  Derm: Negative for rash or itching.  Neuro: Negative for weakness, abnormal sensation, seizure, frequent headaches, memory loss, confusion.  Psych: Negative for anxiety, depression, suicidal ideation, hallucinations.  Endo: Negative for unusual weight change.  Heme: Negative for bruising or bleeding. Allergy: Negative for rash or hives.    Physical Examination:  BP 124/78   Pulse 87   Temp 97.8 F (36.6 C)   Ht 5' 8"  (1.727 m)   Wt 186 lb 3.2 oz (84.5 kg)   BMI 28.31 kg/m    General: Well-nourished, well-developed in no acute distress.  Head: Normocephalic, atraumatic.   Eyes: Conjunctiva pink, no icterus. Mouth: masked. Neck: Supple without thyromegaly, masses, or lymphadenopathy.  Lungs: Clear to auscultation bilaterally.  Heart: Regular rate and rhythm, no murmurs rubs or gallops.  Abdomen: Bowel sounds are normal, nontender, nondistended, no hepatosplenomegaly or masses, no abdominal bruits or    hernia , no rebound or guarding.   Rectal: not performed Extremities: No lower extremity edema. No clubbing or deformities.  Neuro: Alert and oriented x 4 , grossly normal neurologically.  Skin: Warm and dry, no rash or jaundice.   Psych: Alert and cooperative, normal mood and affect.    Imaging Studies: No results found.  Assessment:  70 y/o male presenting to  schedule five year surveillance colonoscopy for history of adenomatous colon polyps. Overall doing well with exception of hemorrhoid flare. Also with chronic GERD, hiccups, currently not adequately controlled. C/o excessive gas.   Plan:  1. Colonoscopy with Dr. Gala Romney. Conscious sedation. ASA II.  I have discussed the risks, alternatives, benefits with regards to but not limited to the risk of reaction to medication, bleeding, infection, perforation and the patient is agreeable to proceed. Written consent to be obtained. Assess for hemorrhoids and possible need for repeat banding if ongoing symptoms.  2. Anusol cream anorectally BID for 10-14 days for hemorrhoids. 3. For gas, consume low FODMAP diet. Handout provided.  4. Pepcid at bedtime nightly for next several weeks instead of prn (which is rare). If persistent hiccups/refractory GERD he should let me know.

## 2020-11-01 NOTE — Patient Instructions (Addendum)
1. Colonoscopy as scheduled. See separate instructions.  2. Anusol cream anorectally twice daily for 10-14 days for hemorrhoids.  3. Avoid foods in the list below of "foods to avoid". These foods may cause more gas/bloating. 4. For hiccups, this could be related to uncontrolled reflux. Please take your Pepcid at bedtime every night for the next few weeks. If you continue to have hiccups, please let me know.   Low-FODMAP Eating Plan  FODMAP stands for fermentable oligosaccharides, disaccharides, monosaccharides, and polyols. These are sugars that are hard for some people to digest. A low-FODMAP eating plan may help some people who have irritable bowel syndrome (IBS) and certain other bowel (intestinal) diseases to manage their symptoms. This meal plan can be complicated to follow. Work with a diet and nutrition specialist (dietitian) to make a low-FODMAP eating plan that is right for you. A dietitian can help make sure that you get enough nutrition from this diet. What are tips for following this plan? Reading food labels  Check labels for hidden FODMAPs such as: ? High-fructose syrup. ? Honey. ? Agave. ? Natural fruit flavors. ? Onion or garlic powder.  Choose low-FODMAP foods that contain 3-4 grams of fiber per serving.  Check food labels for serving sizes. Eat only one serving at a time to make sure FODMAP levels stay low. Shopping  Shop with a list of foods that are recommended on this diet and make a meal plan. Meal planning  Follow a low-FODMAP eating plan for up to 6 weeks, or as told by your health care provider or dietitian.  To follow the eating plan: 1. Eliminate high-FODMAP foods from your diet completely. Choose only low-FODMAP foods to eat. You will do this for 2-6 weeks. 2. Gradually reintroduce high-FODMAP foods into your diet one at a time. Most people should wait a few days before introducing the next new high-FODMAP food into their meal plan. Your dietitian can  recommend how quickly you may reintroduce foods. 3. Keep a daily record of what and how much you eat and drink. Make note of any symptoms that you have after eating. 4. Review your daily record with a dietitian regularly to identify which foods you can eat and which foods you should avoid. General tips  Drink enough fluid each day to keep your urine pale yellow.  Avoid processed foods. These often have added sugar and may be high in FODMAPs.  Avoid most dairy products, whole grains, and sweeteners.  Work with a dietitian to make sure you get enough fiber in your diet.  Avoid high FODMAP foods at meals to manage symptoms. Recommended foods Fruits Bananas, oranges, tangerines, lemons, limes, blueberries, raspberries, strawberries, grapes, cantaloupe, honeydew melon, kiwi, papaya, passion fruit, and pineapple. Limited amounts of dried cranberries, banana chips, and shredded coconut. Vegetables Eggplant, zucchini, cucumber, peppers, green beans, bean sprouts, lettuce, arugula, kale, Swiss chard, spinach, collard greens, bok choy, summer squash, potato, and tomato. Limited amounts of corn, carrot, and sweet potato. Green parts of scallions. Grains Gluten-free grains, such as rice, oats, buckwheat, quinoa, corn, polenta, and millet. Gluten-free pasta, bread, or cereal. Rice noodles. Corn tortillas. Meats and other proteins Unseasoned beef, pork, poultry, or fish. Eggs. Berniece Salines. Tofu (firm) and tempeh. Limited amounts of nuts and seeds, such as almonds, walnuts, [place] nuts, pecans, peanuts, nut butters, pumpkin seeds, chia seeds, and sunflower seeds. Dairy Lactose-free milk, yogurt, and kefir. Lactose-free cottage cheese and ice cream. Non-dairy milks, such as almond, coconut, hemp, and rice milk. Non-dairy yogurt. Limited  amounts of goat cheese, brie, mozzarella, parmesan, swiss, and other hard cheeses. Fats and oils Butter-free spreads. Vegetable oils, such as olive, canola, and sunflower  oil. Seasoning and other foods Artificial sweeteners with names that do not end in "ol," such as aspartame, saccharine, and stevia. Maple syrup, white table sugar, raw sugar, brown sugar, and molasses. Mayonnaise, soy sauce, and tamari. Fresh basil, coriander, parsley, rosemary, and thyme. Beverages Water and mineral water. Sugar-sweetened soft drinks. Small amounts of orange juice or cranberry juice. Black and green tea. Most dry wines. Coffee. The items listed above may not be a complete list of foods and beverages you can eat. Contact a dietitian for more information. Foods to avoid Fruits Fresh, dried, and juiced forms of apple, pear, watermelon, peach, plum, cherries, apricots, blackberries, boysenberries, figs, nectarines, and mango. Avocado. Vegetables Chicory root, artichoke, asparagus, cabbage, snow peas, Brussels sprouts, broccoli, sugar snap peas, mushrooms, celery, and cauliflower. Onions, garlic, leeks, and the white part of scallions. Grains Wheat, including kamut, durum, and semolina. Barley and bulgur. Couscous. Wheat-based cereals. Wheat noodles, bread, crackers, and pastries. Meats and other proteins Fried or fatty meat. Sausage. Cashews and pistachios. Soybeans, baked beans, black beans, chickpeas, kidney beans, fava beans, navy beans, lentils, black-eyed peas, and split peas. Dairy Milk, yogurt, ice cream, and soft cheese. Cream and sour cream. Milk-based sauces. Custard. Buttermilk. Soy milk. Seasoning and other foods Any sugar-free gum or candy. Foods that contain artificial sweeteners such as sorbitol, mannitol, isomalt, or xylitol. Foods that contain honey, high-fructose corn syrup, or agave. Bouillon, vegetable stock, beef stock, and chicken stock. Garlic and onion powder. Condiments made with onion, such as hummus, chutney, pickles, relish, salad dressing, and salsa. Tomato paste. Beverages Chicory-based drinks. Coffee substitutes. Chamomile tea. Fennel tea. Sweet or  fortified wines such as port or sherry. Diet soft drinks made with isomalt, mannitol, maltitol, sorbitol, or xylitol. Apple, pear, and mango juice. Juices with high-fructose corn syrup. The items listed above may not be a complete list of foods and beverages you should avoid. Contact a dietitian for more information. Summary  FODMAP stands for fermentable oligosaccharides, disaccharides, monosaccharides, and polyols. These are sugars that are hard for some people to digest.  A low-FODMAP eating plan is a short-term diet that helps to ease symptoms of certain bowel diseases.  The eating plan usually lasts up to 6 weeks. After that, high-FODMAP foods are reintroduced gradually and one at a time. This can help you find out which foods may be causing symptoms.  A low-FODMAP eating plan can be complicated. It is best to work with a dietitian who has experience with this type of plan. This information is not intended to replace advice given to you by your health care provider. Make sure you discuss any questions you have with your health care provider. Document Revised: 01/06/2020 Document Reviewed: 01/06/2020 Elsevier Patient Education  [place].

## 2020-12-18 ENCOUNTER — Other Ambulatory Visit: Payer: Self-pay

## 2020-12-18 ENCOUNTER — Other Ambulatory Visit (HOSPITAL_COMMUNITY)
Admission: RE | Admit: 2020-12-18 | Discharge: 2020-12-18 | Disposition: A | Discharge status: Home / Self Care | Payer: Medicare Other | Source: Ambulatory Visit | Attending: Internal Medicine | Admitting: Internal Medicine

## 2020-12-18 DIAGNOSIS — Z01812 Encounter for preprocedural laboratory examination: Secondary | ICD-10-CM | POA: Insufficient documentation

## 2020-12-18 DIAGNOSIS — Z20822 Contact with and (suspected) exposure to covid-19: Secondary | ICD-10-CM | POA: Diagnosis not present

## 2020-12-18 LAB — SARS CORONAVIRUS 2 (TAT 6-24 HRS): SARS Coronavirus 2: NEGATIVE

## 2020-12-20 ENCOUNTER — Encounter (HOSPITAL_COMMUNITY): Payer: Self-pay | Admitting: Internal Medicine

## 2020-12-20 ENCOUNTER — Other Ambulatory Visit: Payer: Self-pay

## 2020-12-20 ENCOUNTER — Encounter (HOSPITAL_COMMUNITY)
Admission: RE | Disposition: A | Discharge status: Home / Self Care | Payer: Self-pay | Source: Home / Self Care | Attending: Internal Medicine

## 2020-12-20 ENCOUNTER — Ambulatory Visit (HOSPITAL_COMMUNITY)
Admission: RE | Admit: 2020-12-20 | Discharge: 2020-12-20 | Disposition: A | Discharge status: Home / Self Care | Payer: Medicare Other | Attending: Internal Medicine | Admitting: Internal Medicine

## 2020-12-20 DIAGNOSIS — Z8719 Personal history of other diseases of the digestive system: Secondary | ICD-10-CM | POA: Insufficient documentation

## 2020-12-20 DIAGNOSIS — E7800 Pure hypercholesterolemia, unspecified: Secondary | ICD-10-CM | POA: Insufficient documentation

## 2020-12-20 DIAGNOSIS — E079 Disorder of thyroid, unspecified: Secondary | ICD-10-CM | POA: Diagnosis not present

## 2020-12-20 DIAGNOSIS — Z8579 Personal history of other malignant neoplasms of lymphoid, hematopoietic and related tissues: Secondary | ICD-10-CM | POA: Insufficient documentation

## 2020-12-20 DIAGNOSIS — Z1211 Encounter for screening for malignant neoplasm of colon: Secondary | ICD-10-CM | POA: Insufficient documentation

## 2020-12-20 DIAGNOSIS — Z79899 Other long term (current) drug therapy: Secondary | ICD-10-CM | POA: Insufficient documentation

## 2020-12-20 DIAGNOSIS — Z85528 Personal history of other malignant neoplasm of kidney: Secondary | ICD-10-CM | POA: Insufficient documentation

## 2020-12-20 DIAGNOSIS — Z86010 Personal history of colonic polyps: Secondary | ICD-10-CM | POA: Diagnosis not present

## 2020-12-20 DIAGNOSIS — I10 Essential (primary) hypertension: Secondary | ICD-10-CM | POA: Diagnosis not present

## 2020-12-20 DIAGNOSIS — Z8546 Personal history of malignant neoplasm of prostate: Secondary | ICD-10-CM | POA: Insufficient documentation

## 2020-12-20 DIAGNOSIS — Z9079 Acquired absence of other genital organ(s): Secondary | ICD-10-CM | POA: Insufficient documentation

## 2020-12-20 HISTORY — PX: COLONOSCOPY: SHX5424

## 2020-12-20 SURGERY — COLONOSCOPY
Anesthesia: Moderate Sedation

## 2020-12-20 MED ORDER — MIDAZOLAM HCL 5 MG/5ML IJ SOLN
INTRAMUSCULAR | Status: DC | PRN
  Administered 2020-12-20: 1 mg via INTRAVENOUS
  Administered 2020-12-20: 2 mg via INTRAVENOUS
  Administered 2020-12-20 (×2): 1 mg via INTRAVENOUS

## 2020-12-20 MED ORDER — MIDAZOLAM HCL 5 MG/5ML IJ SOLN
INTRAMUSCULAR | Status: AC
  Filled 2020-12-20: qty 10

## 2020-12-20 MED ORDER — ONDANSETRON HCL 4 MG/2ML IJ SOLN
INTRAMUSCULAR | Status: DC | PRN
  Administered 2020-12-20: 4 mg via INTRAVENOUS

## 2020-12-20 MED ORDER — ONDANSETRON HCL 4 MG/2ML IJ SOLN
INTRAMUSCULAR | Status: AC
  Filled 2020-12-20: qty 2

## 2020-12-20 MED ORDER — MEPERIDINE HCL 50 MG/ML IJ SOLN
INTRAMUSCULAR | Status: AC
  Filled 2020-12-20: qty 1

## 2020-12-20 MED ORDER — SODIUM CHLORIDE 0.9 % IV SOLN
INTRAVENOUS | Status: DC
  Administered 2020-12-20: 1000 mL via INTRAVENOUS

## 2020-12-20 MED ORDER — STERILE WATER FOR IRRIGATION IR SOLN
Status: DC | PRN
  Administered 2020-12-20: 1.5 mL

## 2020-12-20 MED ORDER — MEPERIDINE HCL 100 MG/ML IJ SOLN
INTRAMUSCULAR | Status: DC | PRN
  Administered 2020-12-20: 10 mg via INTRAVENOUS
  Administered 2020-12-20: 40 mg via INTRAVENOUS

## 2020-12-20 NOTE — Op Note (Signed)
[facility] Patient Name: Joshua Hodges Procedure Date: 12/20/2020 9:59 AM MRN: [id number] Date of Birth: [birth year] Attending MD: Norvel Richards , MD CSN: [id number] Age: 70 Admit Type: Outpatient Procedure:                Colonoscopy Indications:              High risk colon cancer surveillance: Personal                            history of colonic polyps Providers:                Norvel Richards, MD, Caprice Kluver, Raphael Gibney, Technician Referring MD:              Medicines:                Midazolam 5 mg IV, Meperidine 50 mg IV Complications:            No immediate complications. Estimated Blood Loss:     Estimated blood loss: none. Procedure:                Pre-Anesthesia Assessment:                           - Prior to the procedure, a History and Physical                            was performed, and patient medications and                            allergies were reviewed. The patient's tolerance of                            previous anesthesia was also reviewed. The risks                            and benefits of the procedure and the sedation                            options and risks were discussed with the patient.                            All questions were answered, and informed consent                            was obtained. Prior Anticoagulants: The patient has                            taken no previous anticoagulant or antiplatelet                            agents. ASA Grade Assessment: II - A patient with  mild systemic disease. After reviewing the risks                            and benefits, the patient was deemed in                            satisfactory condition to undergo the procedure.                           After obtaining informed consent, the colonoscope                            was passed under direct vision. Throughout the                            procedure, the  patient's blood pressure, pulse, and                            oxygen saturations were monitored continuously. The                            CF-HQ190L ([id number]) scope was introduced through                            the anus and advanced to the the cecum, identified                            by appendiceal orifice and ileocecal valve. The                            colonoscopy was performed without difficulty. The                            patient tolerated the procedure well. The quality                            of the bowel preparation was adequate. Scope In: 10:21:33 AM Scope Out: 10:35:51 AM Scope Withdrawal Time: 0 hours 6 minutes 38 seconds  Total Procedure Duration: 0 hours 14 minutes 18 seconds  Findings:      The perianal and digital rectal examinations were normal.      The colon (entire examined portion) appeared normal.      The retroflexed view of the distal rectum and anal verge was normal and       showed no anal or rectal abnormalities. Impression:               - The entire examined colon is normal.                           - The distal rectum and anal verge are normal on                            retroflexion view.                           -  No specimens collected. Moderate Sedation:      Moderate (conscious) sedation was administered by the endoscopy nurse       and supervised by the endoscopist. The following parameters were       monitored: oxygen saturation, heart rate, blood pressure, respiratory       rate, EKG, adequacy of pulmonary ventilation, and response to care.       Total physician intraservice time was 21 minutes. Recommendation:           - Patient has a contact number available for                            emergencies. The signs and symptoms of potential                            delayed complications were discussed with the                            patient. Return to normal activities tomorrow.                            Written  discharge instructions were provided to the                            patient.                           - Resume previous diet.                           - Continue present medications.                           - Await pathology results.                           - Repeat colonoscopy in 5 years for surveillance.                           - Return to GI office after studies are complete. Procedure Code(s):        --- Professional ---                           [phone], Colonoscopy, flexible; diagnostic, including                            collection of specimen(s) by brushing or washing,                            when performed (separate procedure)                           G0500, Moderate sedation services provided by the                            same physician or other qualified health care  professional performing a gastrointestinal                            endoscopic service that sedation supports,                            requiring the presence of an independent trained                            observer to assist in the monitoring of the                            patient's level of consciousness and physiological                            status; initial 15 minutes of intra-service time;                            patient age 33 years or older (additional time may                            be reported with [phone], as appropriate) Diagnosis Code(s):        --- Professional ---                           Z86.010, Personal history of colonic polyps CPT copyright 2019 [employer]. All rights reserved. The codes documented in this report are preliminary and upon coder review may  be revised to meet current compliance requirements. Cristopher Estimable. Mickle Campton, MD Norvel Richards, MD 12/20/2020 10:44:07 AM This report has been signed electronically. Number of Addenda: 0

## 2020-12-20 NOTE — Discharge Instructions (Signed)
  Colonoscopy Discharge Instructions  Read the instructions outlined below and refer to this sheet in the next few weeks. These discharge instructions provide you with general information on caring for yourself after you leave the hospital. Your doctor may also give you specific instructions. While your treatment has been planned according to the most current medical practices available, unavoidable complications occasionally occur. If you have any problems or questions after discharge, call Dr. Gala Romney at [phone]. ACTIVITY  You may resume your regular activity, but move at a slower pace for the next 24 hours.   Take frequent rest periods for the next 24 hours.   Walking will help get rid of the air and reduce the bloated feeling in your belly (abdomen).   No driving for 24 hours (because of the medicine (anesthesia) used during the test).    Do not sign any important legal documents or operate any machinery for 24 hours (because of the anesthesia used during the test).  NUTRITION  Drink plenty of fluids.   You may resume your normal diet as instructed by your doctor.   Begin with a light meal and progress to your normal diet. Heavy or fried foods are harder to digest and may make you feel sick to your stomach (nauseated).   Avoid alcoholic beverages for 24 hours or as instructed.  MEDICATIONS  You may resume your normal medications unless your doctor tells you otherwise.  WHAT YOU CAN EXPECT TODAY  Some feelings of bloating in the abdomen.   Passage of more gas than usual.   Spotting of blood in your stool or on the toilet paper.  IF YOU HAD POLYPS REMOVED DURING THE COLONOSCOPY:  No aspirin products for 7 days or as instructed.   No alcohol for 7 days or as instructed.   Eat a soft diet for the next 24 hours.  FINDING OUT THE RESULTS OF YOUR TEST Not all test results are available during your visit. If your test results are not back during the visit, make an appointment  with your caregiver to find out the results. Do not assume everything is normal if you have not heard from your caregiver or the medical facility. It is important for you to follow up on all of your test results.  SEEK IMMEDIATE MEDICAL ATTENTION IF:  You have more than a spotting of blood in your stool.   Your belly is swollen (abdominal distention).   You are nauseated or vomiting.   You have a temperature over 101.   You have abdominal pain or discomfort that is severe or gets worse throughout the day.   No polyps found today.  Recommend 1 more colonoscopy in 5 years  At patient request, I called Dorice Lamas at [phone]

## 2020-12-20 NOTE — H&P (Signed)
_0 @   Primary Care Physician:  Abran Richard, MD Primary Gastroenterologist:  Dr. Gala Romney  Pre-Procedure History & Physical: HPI:  Joshua Hodges is a 70 y.o. male here for surveillance colonoscopy.  History of colonic adenoma removed previously.  No bowel symptoms currently.  Past Medical History:  Diagnosis Date  . GERD (gastroesophageal reflux disease)   . H/O colonoscopy   . Hemorrhoids   . Hypercholesteremia   . Hypertension   . Joint pain   . Low testosterone   . Prostate cancer ([place])   . Rectal bleeding   . Thyroid disease none in last 9 months   for 3 months, then took off medication  . Tubular adenoma     Past Surgical History:  Procedure Laterality Date  . COLONOSCOPY    . COLONOSCOPY N/A 10/27/2012   jenkins: 1. normal colon 2. internal hemorrhoids   . COLONOSCOPY N/A 08/11/2015   RMR: Friable anal canal hemorrhoids likely source of paper hematochezia. Single colon polyp removed as described above. CT abnormality suggested likely artifactual in origin.   Marland Kitchen HERNIA REPAIR  1960   at age 89  . LYMPHADENECTOMY Bilateral 06/01/2015   Procedure: BILATERAL LYMPHADENECTOMY;  Surgeon: Raynelle Bring, MD;  Location: [facility];  Service: Urology;  Laterality: Bilateral;  . PROSTATE BIOPSY    . ROBOT ASSISTED LAPAROSCOPIC RADICAL PROSTATECTOMY N/A 06/01/2015   Procedure: ROBOTIC ASSISTED LAPAROSCOPIC RADICAL PROSTATECTOMY LEVEL 2;  Surgeon: Raynelle Bring, MD;  Location: [facility];  Service: Urology;  Laterality: N/A;  . [place]    Prior to Admission medications   Medication Sig Start Date End Date Taking? Authorizing Provider  amLODipine (NORVASC) 10 MG tablet Take 10 mg by mouth daily. 04/18/15  Yes [provider]  atorvastatin (LIPITOR) 20 MG tablet Take 20 mg by mouth every morning.  04/24/15  Yes [provider]  cholecalciferol (VITAMIN D) 1000 UNITS tablet Take 2,000 Units by mouth daily.   Yes [provider]  diclofenac Sodium (VOLTAREN)  1 % GEL Apply 1 application topically at bedtime.   Yes [provider]  famotidine (PEPCID) 20 MG tablet Take 20 mg by mouth daily as needed for heartburn or indigestion.   Yes [provider]  fluticasone (FLONASE) 50 MCG/ACT nasal spray Place 1-2 sprays into both nostrils daily as needed for allergies.   Yes [provider]  hydrocortisone (ANUSOL-HC) 2.5 % rectal cream Place 1 application rectally 2 (two) times daily. For 14 days. Patient taking differently: Place 1 application rectally 2 (two) times daily as needed for hemorrhoids. 11/01/20  Yes Mahala Menghini, PA-C  Multiple Vitamin (MULTIVITAMIN) capsule Take 1 capsule by mouth daily.   Yes [provider]  traZODone (DESYREL) 50 MG tablet Take 50 mg by mouth at bedtime as needed for sleep.   Yes [provider]  Wheat Dextrin (BENEFIBER DRINK MIX PO) Take 1 Scoop by mouth every other day. 4 grams per day   Yes [provider]    Allergies as of 11/01/2020  . (No Known Allergies)    Family History  Problem Relation Age of Onset  . Colon cancer Neg Hx   . Multiple myeloma Father   . Cancer Paternal Uncle        kidney cancer    Social History   Socioeconomic History  . Marital status: Married    Spouse name: Not on file  . Number of children: Not on file  . Years of education: Not on file  .  Highest education level: Not on file  Occupational History  . Not on file  Tobacco Use  . Smoking status: Never Smoker  . Smokeless tobacco: Never Used  Substance and Sexual Activity  . Alcohol use: No    Comment: none since 1985  . Drug use: No    Types: Marijuana    Comment: none since 1985  . Sexual activity: Yes  Other Topics Concern  . Not on file  Social History Narrative  . Not on file   Social Determinants of Health   Financial Resource Strain: Not on file  Food Insecurity: Not on file  Transportation Needs: Not on file  Physical Activity: Not on file  Stress:  Not on file  Social Connections: Not on file  Intimate Partner Violence: Not on file    Review of Systems: See HPI, otherwise negative ROS  Physical Exam: BP 131/75   Pulse 85   Temp 97.9 F (36.6 C) (Oral)   Resp 13   Ht _0  (1.727 m)   Wt 83.5 kg   SpO2 99%   BMI 27.98 kg/m  General:   Alert,  Well-developed, well-nourished, pleasant and cooperative in NAD Neck:  Supple; no masses or thyromegaly. No significant cervical adenopathy. Lungs:  Clear throughout to auscultation.   No wheezes, crackles, or rhonchi. No acute distress. Heart:  Regular rate and rhythm; no murmurs, clicks, rubs,  or gallops. Abdomen: Non-distended, normal bowel sounds.  Soft and nontender without appreciable mass or hepatosplenomegaly.   Impression/Plan: 70 year old gentleman here for surveillance examination given history of colonic adenoma. The risks, benefits, limitations, alternatives and imponderables have been reviewed with the patient. Questions have been answered. All parties are agreeable.      Notice: This dictation was prepared with Dragon dictation along with smaller phrase technology. Any transcriptional errors that result from this process are unintentional and may not be corrected upon review.

## 2020-12-22 ENCOUNTER — Encounter (HOSPITAL_COMMUNITY): Payer: Self-pay | Admitting: Internal Medicine

## 2021-05-16 ENCOUNTER — Encounter: Payer: Self-pay | Admitting: Dermatology

## 2021-05-16 ENCOUNTER — Other Ambulatory Visit: Payer: Self-pay

## 2021-05-16 ENCOUNTER — Ambulatory Visit: Payer: Medicare Other | Admitting: Dermatology

## 2021-05-16 DIAGNOSIS — Z1283 Encounter for screening for malignant neoplasm of skin: Secondary | ICD-10-CM | POA: Diagnosis not present

## 2021-05-16 DIAGNOSIS — L821 Other seborrheic keratosis: Secondary | ICD-10-CM

## 2021-05-27 ENCOUNTER — Encounter: Payer: Self-pay | Admitting: Dermatology

## 2021-05-27 NOTE — Progress Notes (Signed)
   Follow-Up Visit   Subjective  Joshua Hodges is a 70 y.o. male who presents for the following: Annual Exam (Right scalp - Ln2 April 28,2022 by dr hall- just wants to be rechecked).  Check spots on scalp, general skin examination. Location:  Duration:  Quality:  Associated Signs/Symptoms: Modifying Factors:  Severity:  Timing: Context:   Objective  Well appearing patient in no apparent distress; mood and affect are within normal limits. Mid Parietal Scalp 1 cm brown textured papule anterior to 2.7 cm hypopigmented macule (apparently this happened after the spot was frozen).   the textured lesion is stable dermoscopy.  In     Torso - Posterior (Back) Waist up skin examination no atypical pigmented lesions or nonmelanoma skin cancer    All skin waist up examined.   Assessment & Plan    Seborrheic keratosis Mid Parietal Scalp  No intervention necessary if clinically stable.  Encounter for screening for malignant neoplasm of skin Torso - Posterior (Back)  Annual skin examination.      I, Lavonna Monarch, MD, have reviewed all documentation for this visit.  The documentation on 05/27/21 for the exam, diagnosis, procedures, and orders are all accurate and complete.

## 2021-05-29 NOTE — Addendum Note (Signed)
Addended by: Lavonna Monarch on: 05/29/2021 08:42 AM   Modules accepted: Level of Service

## 2021-11-01 ENCOUNTER — Ambulatory Visit
Admission: EM | Admit: 2021-11-01 | Discharge: 2021-11-01 | Disposition: A | Discharge status: Home / Self Care | Payer: Medicare Other | Attending: Family Medicine | Admitting: Family Medicine

## 2021-11-01 ENCOUNTER — Other Ambulatory Visit: Payer: Self-pay

## 2021-11-01 ENCOUNTER — Encounter: Payer: Self-pay | Admitting: Emergency Medicine

## 2021-11-01 DIAGNOSIS — J0100 Acute maxillary sinusitis, unspecified: Secondary | ICD-10-CM | POA: Diagnosis not present

## 2021-11-01 MED ORDER — AMOXICILLIN-POT CLAVULANATE 875-125 MG PO TABS: 1.0000 | ORAL_TABLET | Freq: Two times a day (BID) | ORAL | 0 refills | Status: DC

## 2021-11-01 NOTE — ED Triage Notes (Addendum)
Pt reports cough, headache, sinus congestion x1 week. Pt denies any fevers, body aches, or gi/gu symptoms.  ?

## 2021-11-01 NOTE — ED Provider Notes (Signed)
?[facility] ? ? ? ?CSN: [id number] ?Arrival date & time: 11/01/21  6468 ? ? ?  ? ?History   ?Chief Complaint ?Chief Complaint  ?Patient presents with  ? Cough  ? ? ?HPI ?Joshua Hodges is a 71 y.o. male.  ? ?Presenting today with over a week of cough, sinus congestion, headache, sinus pain and pressure.  States that he thought he was getting a bit better earlier in the week but has worsened the past few days.  Denies fever, body aches, chills, chest pain, shortness of breath, abdominal pain, nausea vomiting or diarrhea.  So far has taken nasal sprays, over-the-counter cold and congestion medications, pain relievers with no relief. ? ? ?Past Medical History:  ?Diagnosis Date  ? GERD (gastroesophageal reflux disease)   ? H/O colonoscopy   ? Hemorrhoids   ? Hypercholesteremia   ? Hypertension   ? Joint pain   ? Low testosterone   ? Prostate cancer ([place])   ? Rectal bleeding   ? Thyroid disease none in last 9 months  ? for 3 months, then took off medication  ? Tubular adenoma   ? ? ?Patient Active Problem List  ? Diagnosis Date Noted  ? History of colonic polyps   ? Hemorrhoids 07/31/2015  ? Constipation 05/30/2015  ? Abnormal CT of the abdomen 05/30/2015  ? GERD (gastroesophageal reflux disease) 05/30/2015  ? Prostate cancer ([place]) 05/05/2015  ? ? ?Past Surgical History:  ?Procedure Laterality Date  ? COLONOSCOPY    ? COLONOSCOPY N/A 10/27/2012  ? jenkins: 1. normal colon 2. internal hemorrhoids   ? COLONOSCOPY N/A 08/11/2015  ? RMR: Friable anal canal hemorrhoids likely source of paper hematochezia. Single colon polyp removed as described above. CT abnormality suggested likely artifactual in origin.   ? COLONOSCOPY N/A 12/20/2020  ? Procedure: COLONOSCOPY;  Surgeon: Daneil Dolin, MD;  [facility];  Service: Endoscopy;  Laterality: N/A;  am appt  ? HERNIA REPAIR  1960  ? at age 83  ? LYMPHADENECTOMY Bilateral 06/01/2015  ? Procedure: BILATERAL LYMPHADENECTOMY;  Surgeon: Raynelle Bring, MD;  Location:  [facility];  Service: Urology;  Laterality: Bilateral;  ? PROSTATE BIOPSY    ? ROBOT ASSISTED LAPAROSCOPIC RADICAL PROSTATECTOMY N/A 06/01/2015  ? Procedure: ROBOTIC ASSISTED LAPAROSCOPIC RADICAL PROSTATECTOMY LEVEL 2;  Surgeon: Raynelle Bring, MD;  [facility];  Service: Urology;  Laterality: N/A;  ? VASECTOMY  1991  ? ? ? ? ? ?Home Medications   ? ?Prior to Admission medications   ?Medication Sig Start Date End Date Taking? Authorizing Provider  ?amoxicillin-clavulanate (AUGMENTIN) 875-125 MG tablet Take 1 tablet by mouth every 12 (twelve) hours. 11/01/21  Yes Volney American, PA-C  ?amLODipine (NORVASC) 10 MG tablet Take 10 mg by mouth daily. 04/18/15   [provider]  ?atorvastatin (LIPITOR) 20 MG tablet Take 20 mg by mouth every morning.  04/24/15   [provider]  ?cholecalciferol (VITAMIN D) 1000 UNITS tablet Take 2,000 Units by mouth daily.    [provider]  ?diclofenac Sodium (VOLTAREN) 1 % GEL Apply 1 application topically at bedtime.    [provider]  ?famotidine (PEPCID) 20 MG tablet Take 20 mg by mouth daily as needed for heartburn or indigestion.    [provider]  ?fluticasone (FLONASE) 50 MCG/ACT nasal spray Place 1-2 sprays into both nostrils daily as needed for allergies.    [provider]  ?hydrocortisone (ANUSOL-HC) 2.5 % rectal cream Place 1 application rectally 2 (two)  times daily. For 14 days. ?Patient taking differently: Place 1 application rectally 2 (two) times daily as needed for hemorrhoids. 11/01/20   Mahala Menghini, PA-C  ?Multiple Vitamin (MULTIVITAMIN) capsule Take 1 capsule by mouth daily.    [provider]  ?traZODone (DESYREL) 50 MG tablet Take 50 mg by mouth at bedtime as needed for sleep.    [provider]  ?Wheat Dextrin (BENEFIBER DRINK MIX PO) Take 1 Scoop by mouth every other day. 4 grams per day    [provider]  ? ? ?Family History ?Family History  ?Problem Relation Age of Onset  ?  Colon cancer Neg Hx   ? Multiple myeloma Father   ? Cancer Paternal Uncle   ?     kidney cancer  ? ? ?Social History ?Social History  ? ?Tobacco Use  ? Smoking status: Never  ? Smokeless tobacco: Never  ?Substance Use Topics  ? Alcohol use: No  ?  Comment: none since 1985  ? Drug use: No  ?  Types: Marijuana  ?  Comment: none since 1985  ? ? ? ?Allergies   ?Patient has no known allergies. ? ? ?Review of Systems ?Review of Systems ?Per HPI ? ?Physical Exam ?Triage Vital Signs ?ED Triage Vitals  ?Enc Vitals Group  ?   BP 11/01/21 1130 138/87  ?   Pulse Rate 11/01/21 1130 96  ?   Resp 11/01/21 1130 18  ?   Temp 11/01/21 1130 98.4 ?F (36.9 ?C)  ?   Temp Source 11/01/21 1130 Oral  ?   SpO2 11/01/21 1130 97 %  ?   Weight 11/01/21 1131 184 lb (83.5 kg)  ?   Height 11/01/21 1131 5' 8"  (1.727 m)  ?   Head Circumference --   ?   Peak Flow --   ?   Pain Score 11/01/21 1131 6  ?   Pain Loc --   ?   Pain Edu? --   ?   Excl. in [place]? --   ? ?No data found. ? ?Updated Vital Signs ?BP 138/87 (BP Location: Right Arm)   Pulse 96   Temp 98.4 ?F (36.9 ?C) (Oral)   Resp 18   Ht 5' 8"  (1.727 m)   Wt 184 lb (83.5 kg)   SpO2 97%   BMI 27.98 kg/m?  ? ?Visual Acuity ?Right Eye Distance:   ?Left Eye Distance:   ?Bilateral Distance:   ? ?Right Eye Near:   ?Left Eye Near:    ?Bilateral Near:    ? ?Physical Exam ?Vitals and nursing note reviewed.  ?Constitutional:   ?   Appearance: Normal appearance. He is well-developed.  ?HENT:  ?   Head: Atraumatic.  ?   Right Ear: External ear normal.  ?   Left Ear: External ear normal.  ?   Nose: Congestion present.  ?   Comments: Copious thick green nasal congestion present in nares ?   Mouth/Throat:  ?   Pharynx: Posterior oropharyngeal erythema present. No oropharyngeal exudate.  ?Eyes:  ?   Extraocular Movements: Extraocular movements intact.  ?   Conjunctiva/sclera: Conjunctivae normal.  ?   Pupils: Pupils are equal, round, and reactive to light.  ?Cardiovascular:  ?   Rate and Rhythm: Normal rate  and regular rhythm.  ?Pulmonary:  ?   Effort: Pulmonary effort is normal. No respiratory distress.  ?   Breath sounds: Normal breath sounds. No wheezing or rales.  ?Musculoskeletal:     ?  General: Normal range of motion.  ?   Cervical back: Normal range of motion and neck supple.  ?Lymphadenopathy:  ?   Cervical: No cervical adenopathy.  ?Skin: ?   General: Skin is warm and dry.  ?Neurological:  ?   General: No focal deficit present.  ?   Mental Status: He is alert and oriented to person, place, and time.  ?Psychiatric:     ?   Mood and Affect: Mood normal.     ?   Behavior: Behavior normal.     ?   Thought Content: Thought content normal.     ?   Judgment: Judgment normal.  ? ? ? ?UC Treatments / Results  ?Labs ?(all labs ordered are listed, but only abnormal results are displayed) ?Labs Reviewed - No data to display ? ?EKG ? ? ?Radiology ?No results found. ? ?Procedures ?Procedures (including critical care time) ? ?Medications Ordered in UC ?Medications - No data to display ? ?Initial Impression / Assessment and Plan / UC Course  ?I have reviewed the triage vital signs and the nursing notes. ? ?Pertinent labs & imaging results that were available during my care of the patient were reviewed by me and considered in my medical decision making (see chart for details). ? ?  ? ?Treat with Augmentin, nasal sprays, sinus rinses and other supportive over-the-counter measures.  Return for acutely worsening symptoms. ? ?Final Clinical Impressions(s) / UC Diagnoses  ? ?Final diagnoses:  ?Acute maxillary sinusitis, recurrence not specified  ? ?Discharge Instructions   ?None ?  ? ?ED Prescriptions   ? ? Medication Sig Dispense Auth. Provider  ? amoxicillin-clavulanate (AUGMENTIN) 875-125 MG tablet Take 1 tablet by mouth every 12 (twelve) hours. 14 tablet Volney American, [place]  ? ?  ? ?PDMP not reviewed this encounter. ?  ?Volney American, PA-C ?11/01/21 1209 ? ?

## 2024-08-17 ENCOUNTER — Ambulatory Visit

## 2024-09-06 ENCOUNTER — Ambulatory Visit: Admitting: Internal Medicine

## 2024-09-06 ENCOUNTER — Encounter: Payer: Self-pay | Admitting: *Deleted

## 2024-09-06 ENCOUNTER — Encounter: Payer: Self-pay | Admitting: Internal Medicine

## 2024-09-06 VITALS — BP 137/77 | HR 73 | Temp 98.1°F | Ht 68.0 in | Wt 189.6 lb

## 2024-09-06 DIAGNOSIS — R1319 Other dysphagia: Secondary | ICD-10-CM

## 2024-09-06 DIAGNOSIS — R1310 Dysphagia, unspecified: Secondary | ICD-10-CM | POA: Diagnosis not present

## 2024-09-06 DIAGNOSIS — Z860101 Personal history of adenomatous and serrated colon polyps: Secondary | ICD-10-CM

## 2024-09-06 DIAGNOSIS — K921 Melena: Secondary | ICD-10-CM | POA: Diagnosis not present

## 2024-09-06 DIAGNOSIS — K2100 Gastro-esophageal reflux disease with esophagitis, without bleeding: Secondary | ICD-10-CM

## 2024-09-06 DIAGNOSIS — K219 Gastro-esophageal reflux disease without esophagitis: Secondary | ICD-10-CM

## 2024-09-06 NOTE — Telephone Encounter (Signed)
 Notification or Prior Authorization is not required for the requested services You are not required to submit a notification/prior authorization based on the information provided. If you have general questions about the prior authorization requirements, visit [email or web address] > Clinician Resources > Advance and Admission Notification Requirements. The number above acknowledges your notification. Please write this reference number down for future reference. If you would like to request an organization determination, please call us  at [phone]. Decision ID #: [id number]

## 2024-09-06 NOTE — Progress Notes (Unsigned)
 "   Gastroenterology Progress Note    Primary Care Physician:  [name], MD Primary Gastroenterologist:  Dr. Shaaron  Pre-Procedure History & Physical: HPI:  Joshua Hodges is a 74 y.o. male here for further evaluation of poorly controlled reflux.  Has been taking Pepcid 10 mg daily for years-has frequent breakthrough symptoms.  Had been on omeprazole  which was doing very well;  Dr. Knowlton took him off this medication 10 years ago for fears of dementia.  He has intermittent esophageal dysphagia as well no prior EGD.  History of colonic adenomas last colonoscopy 2020 treated-free of adenomas.  Due 2027.  As a side note, patient notes when he lifts things like his lawn more in a car he has he passes blood for a day or 2 then it goes away known hemorrhoids excellent response to hemorrhoid banding in 2019  Past Medical History:  Diagnosis Date   GERD (gastroesophageal reflux disease)    H/O colonoscopy    Hemorrhoids    Hypercholesteremia    Hypertension    Joint pain    Low testosterone    Prostate cancer ([facility])    Rectal bleeding    Thyroid disease none in last 9 months   for 3 months, then took off medication   Tubular adenoma     Past Surgical History:  Procedure Laterality Date   COLONOSCOPY     COLONOSCOPY N/A 10/27/2012   jenkins: 1. normal colon 2. internal hemorrhoids    COLONOSCOPY N/A 08/11/2015   RMR: Friable anal canal hemorrhoids likely source of paper hematochezia. Single colon polyp removed as described above. CT abnormality suggested likely artifactual in origin.    COLONOSCOPY N/A 12/20/2020   Procedure: COLONOSCOPY;  Surgeon: [name], MD;  [facility];  Service: Endoscopy;  Laterality: N/A;  am appt   HERNIA REPAIR  1960   at age 42   LYMPHADENECTOMY Bilateral 06/01/2015   Procedure: BILATERAL LYMPHADENECTOMY;  Surgeon: [name], MD;  [facility]: [facility];  Service: Urology;  Laterality: Bilateral;   PROSTATE BIOPSY     ROBOT ASSISTED  LAPAROSCOPIC RADICAL PROSTATECTOMY N/A 06/01/2015   Procedure: ROBOTIC ASSISTED LAPAROSCOPIC RADICAL PROSTATECTOMY LEVEL 2;  Surgeon: [name], MD;  [facility];  Service: Urology;  Laterality: N/A;   VASECTOMY  1991    Prior to Admission medications  Medication Sig Start Date End Date Taking? Authorizing Provider  amLODipine  (NORVASC ) 10 MG tablet Take 10 mg by mouth daily. 04/18/15  Yes [provider]  atorvastatin  (LIPITOR) 20 MG tablet Take 20 mg by mouth every morning.  04/24/15  Yes [provider]  cholecalciferol (VITAMIN D) 1000 UNITS tablet Take 2,000 Units by mouth daily.   Yes [provider]  famotidine (PEPCID) 10 MG tablet Take 10 mg by mouth daily as needed for heartburn or indigestion.   Yes [provider]  Omega-3 Fatty Acids (OMEGA 3 FISH OIL) 1000 MG CAPS Take by mouth.   Yes [provider]  sildenafil (VIAGRA) 100 MG tablet Take 100 mg by mouth as needed. 07/16/24  Yes [provider]  traZODone (DESYREL) 50 MG tablet Take 50 mg by mouth at bedtime as needed for sleep.   Yes [provider]    Allergies as of 09/06/2024   (No Known Allergies)    Family History  Problem Relation Age of Onset   Colon cancer Neg Hx    Multiple myeloma Father    Cancer Paternal Uncle  kidney cancer    Social History   Socioeconomic History   Marital status: Married    Spouse name: Not on file   Number of children: Not on file   Years of education: Not on file   Highest education level: Not on file  Occupational History   Not on file  Tobacco Use   Smoking status: Never   Smokeless tobacco: Never  Substance and Sexual Activity   Alcohol use: No    Comment: none since 1985   Drug use: No    Types: Marijuana    Comment: none since 1985   Sexual activity: Yes  Other Topics Concern   Not on file  Social History Narrative   Not on file   Social Drivers of Health   Tobacco Use: Low Risk  (09/06/2024)   Patient History    Smoking Tobacco Use: Never    Smokeless Tobacco Use: Never    Passive Exposure: Not on file  Financial Resource Strain: Not on file  Food Insecurity: Not on file  Transportation Needs: Not on file  Physical Activity: Not on file  Stress: Not on file  Social Connections: Not on file  Intimate Partner Violence: Not on file  Depression (EYV7-0): Not on file  Alcohol Screen: Not on file  Housing: Not on file  Utilities: Not on file  Health Literacy: Not on file    Review of Systems   See HPI, otherwise negative ROS  Physical Exam: BP 137/77   Pulse 73   Temp 98.1 F (36.7 C) (Oral)   Ht 5' 8 (1.727 m)   Wt 189 lb 9.6 oz (86 kg)   SpO2 97%   BMI 28.83 kg/m  General:   Alert,  Well-developed, well-nourished, pleasant and cooperative in NAD Neck:  Supple; no masses or thyromegaly. No significant cervical adenopathy. Lungs:  Clear throughout to auscultation.   No wheezes, crackles, or rhonchi. No acute distress. Heart:  Regular rate and rhythm; no murmurs, clicks, rubs,  or gallops. Abdomen: Non-distended, normal bowel sounds.  Soft and nontender without appreciable mass or hepatosplenomegaly.    Impression/Plan:   Very pleasant 74 year old gentleman with poorly controlled GERD for at least 20 years.  Intermittent esophageal dysphagia.  Needs an EGD to further evaluate.  Regimen for reflux is woefully in-adequate.  History of colonic adenoma; due for surveillance exam 2027  Intermittent hematochezia exclusively after he lifts heavy objects; he has known hemorrhoids  We will likely bring him back to the office post EGD for hemorrhoid banding.  See no need to move up his colonoscopy at this time.  Recommendations:  GERD information provided and hemorrhoid banding pamphlet provided  Begin Protonix  40 mg daily best taken 30 minutes for breakfast (dispense 30 with 11 refills)  To further evaluate longstanding acid reflux and trouble  swallowing occasionally.  We will set up an EGD with possible esophageal dilation-ASA 2.  The risks, benefits, limitations, alternatives and imponderables have been reviewed with the patient. Potential for esophageal dilation, biopsy, etc. have also been reviewed.  Questions have been answered. All parties agreeable.   He may benefit from hemorrhoid banding in the near future  Colonoscopy due next year for history of colonic polyps  Further recommendations to follow.     Notice: This dictation was prepared with Dragon dictation along with smaller phrase technology. Any transcriptional errors that result from this process are unintentional and may not be corrected upon review.  "

## 2024-09-06 NOTE — H&P (View-Only) (Signed)
 "   Gastroenterology Progress Note    Primary Care Physician:  [name], MD Primary Gastroenterologist:  Dr. Shaaron  Pre-Procedure History & Physical: HPI:  Joshua Hodges is a 74 y.o. male here for further evaluation of poorly controlled reflux.  Has been taking Pepcid 10 mg daily for years-has frequent breakthrough symptoms.  Had been on omeprazole  which was doing very well;  Dr. Knowlton took him off this medication 10 years ago for fears of dementia.  He has intermittent esophageal dysphagia as well no prior EGD.  History of colonic adenomas last colonoscopy 2020 treated-free of adenomas.  Due 2027.  As a side note, patient notes when he lifts things like his lawn more in a car he has he passes blood for a day or 2 then it goes away known hemorrhoids excellent response to hemorrhoid banding in 2019  Past Medical History:  Diagnosis Date   GERD (gastroesophageal reflux disease)    H/O colonoscopy    Hemorrhoids    Hypercholesteremia    Hypertension    Joint pain    Low testosterone    Prostate cancer (HCC)    Rectal bleeding    Thyroid disease none in last 9 months   for 3 months, then took off medication   Tubular adenoma     Past Surgical History:  Procedure Laterality Date   COLONOSCOPY     COLONOSCOPY N/A 10/27/2012   jenkins: 1. normal colon 2. internal hemorrhoids    COLONOSCOPY N/A 08/11/2015   RMR: Friable anal canal hemorrhoids likely source of paper hematochezia. Single colon polyp removed as described above. CT abnormality suggested likely artifactual in origin.    COLONOSCOPY N/A 12/20/2020   Procedure: COLONOSCOPY;  Surgeon: [name], MD;  Location: [facility];  Service: Endoscopy;  Laterality: N/A;  am appt   HERNIA REPAIR  1960   at age 42   LYMPHADENECTOMY Bilateral 06/01/2015   Procedure: BILATERAL LYMPHADENECTOMY;  Surgeon: [name], MD;  Location: [facility];  Service: Urology;  Laterality: Bilateral;   PROSTATE BIOPSY     ROBOT ASSISTED  LAPAROSCOPIC RADICAL PROSTATECTOMY N/A 06/01/2015   Procedure: ROBOTIC ASSISTED LAPAROSCOPIC RADICAL PROSTATECTOMY LEVEL 2;  Surgeon: [name], MD;  Location: [facility];  Service: Urology;  Laterality: N/A;   VASECTOMY  1991    Prior to Admission medications  Medication Sig Start Date End Date Taking? Authorizing Provider  amLODipine  (NORVASC ) 10 MG tablet Take 10 mg by mouth daily. 04/18/15  Yes [provider]  atorvastatin  (LIPITOR) 20 MG tablet Take 20 mg by mouth every morning.  04/24/15  Yes [provider]  cholecalciferol (VITAMIN D) 1000 UNITS tablet Take 2,000 Units by mouth daily.   Yes [provider]  famotidine (PEPCID) 10 MG tablet Take 10 mg by mouth daily as needed for heartburn or indigestion.   Yes [provider]  Omega-3 Fatty Acids (OMEGA 3 FISH OIL) 1000 MG CAPS Take by mouth.   Yes [provider]  sildenafil (VIAGRA) 100 MG tablet Take 100 mg by mouth as needed. 07/16/24  Yes [provider]  traZODone (DESYREL) 50 MG tablet Take 50 mg by mouth at bedtime as needed for sleep.   Yes [provider]    Allergies as of 09/06/2024   (No Known Allergies)    Family History  Problem Relation Age of Onset   Colon cancer Neg Hx    Multiple myeloma Father    Cancer Paternal Uncle  kidney cancer    Social History   Socioeconomic History   Marital status: Married    Spouse name: Not on file   Number of children: Not on file   Years of education: Not on file   Highest education level: Not on file  Occupational History   Not on file  Tobacco Use   Smoking status: Never   Smokeless tobacco: Never  Substance and Sexual Activity   Alcohol use: No    Comment: none since 1985   Drug use: No    Types: Marijuana    Comment: none since 1985   Sexual activity: Yes  Other Topics Concern   Not on file  Social History Narrative   Not on file   Social Drivers of Health   Tobacco Use: Low Risk  (09/06/2024)   Patient History    Smoking Tobacco Use: Never    Smokeless Tobacco Use: Never    Passive Exposure: Not on file  Financial Resource Strain: Not on file  Food Insecurity: Not on file  Transportation Needs: Not on file  Physical Activity: Not on file  Stress: Not on file  Social Connections: Not on file  Intimate Partner Violence: Not on file  Depression (EYV7-0): Not on file  Alcohol Screen: Not on file  Housing: Not on file  Utilities: Not on file  Health Literacy: Not on file    Review of Systems   See HPI, otherwise negative ROS  Physical Exam: BP 137/77   Pulse 73   Temp 98.1 F (36.7 C) (Oral)   Ht 5' 8 (1.727 m)   Wt 189 lb 9.6 oz (86 kg)   SpO2 97%   BMI 28.83 kg/m  General:   Alert,  Well-developed, well-nourished, pleasant and cooperative in NAD Neck:  Supple; no masses or thyromegaly. No significant cervical adenopathy. Lungs:  Clear throughout to auscultation.   No wheezes, crackles, or rhonchi. No acute distress. Heart:  Regular rate and rhythm; no murmurs, clicks, rubs,  or gallops. Abdomen: Non-distended, normal bowel sounds.  Soft and nontender without appreciable mass or hepatosplenomegaly.    Impression/Plan:   Very pleasant 74 year old gentleman with poorly controlled GERD for at least 20 years.  Intermittent esophageal dysphagia.  Needs an EGD to further evaluate.  Regimen for reflux is woefully in-adequate.  History of colonic adenoma; due for surveillance exam 2027  Intermittent hematochezia exclusively after he lifts heavy objects; he has known hemorrhoids  We will likely bring him back to the office post EGD for hemorrhoid banding.  See no need to move up his colonoscopy at this time.  Recommendations:  GERD information provided and hemorrhoid banding pamphlet provided  Begin Protonix  40 mg daily best taken 30 minutes for breakfast (dispense 30 with 11 refills)  To further evaluate longstanding acid reflux and trouble  swallowing occasionally.  We will set up an EGD with possible esophageal dilation-ASA 2.  The risks, benefits, limitations, alternatives and imponderables have been reviewed with the patient. Potential for esophageal dilation, biopsy, etc. have also been reviewed.  Questions have been answered. All parties agreeable.   He may benefit from hemorrhoid banding in the near future  Colonoscopy due next year for history of colonic polyps  Further recommendations to follow.     Notice: This dictation was prepared with Dragon dictation along with smaller phrase technology. Any transcriptional errors that result from this process are unintentional and may not be corrected upon review.  "

## 2024-09-06 NOTE — Patient Instructions (Signed)
 It was good to see you again today!  GERD information provided and hemorrhoid banding pamphlet provided  Begin Protonix  40 mg daily best taken 30 minutes for breakfast (dispense 30 with 11 refills)  To further evaluate your longstanding acid reflux and trouble swallowing occasionally we will set up an EGD with possible esophageal dilation-ASA 2  He may benefit from hemorrhoid banding in the near future  Colonoscopy due next year for history of colonic polyps  Further recommendations to follow.

## 2024-09-08 ENCOUNTER — Other Ambulatory Visit: Payer: Self-pay

## 2024-09-08 MED ORDER — PANTOPRAZOLE SODIUM 40 MG PO TBEC: 40.0000 mg | DELAYED_RELEASE_TABLET | Freq: Every day | ORAL | 3 refills | Status: AC

## 2024-09-17 ENCOUNTER — Encounter (HOSPITAL_COMMUNITY): Admission: RE | Disposition: A | Payer: Self-pay | Source: Home / Self Care | Attending: Internal Medicine

## 2024-09-17 ENCOUNTER — Ambulatory Visit (HOSPITAL_COMMUNITY)
Admission: RE | Admit: 2024-09-17 | Discharge: 2024-09-17 | Disposition: A | Discharge status: Home / Self Care | Attending: Internal Medicine | Admitting: Internal Medicine

## 2024-09-17 ENCOUNTER — Encounter (HOSPITAL_COMMUNITY): Payer: Self-pay | Admitting: Internal Medicine

## 2024-09-17 ENCOUNTER — Encounter (HOSPITAL_COMMUNITY): Admitting: Anesthesiology

## 2024-09-17 ENCOUNTER — Other Ambulatory Visit: Payer: Self-pay

## 2024-09-17 ENCOUNTER — Ambulatory Visit (HOSPITAL_COMMUNITY): Admitting: Anesthesiology

## 2024-09-17 DIAGNOSIS — K449 Diaphragmatic hernia without obstruction or gangrene: Secondary | ICD-10-CM | POA: Diagnosis not present

## 2024-09-17 DIAGNOSIS — K2270 Barrett's esophagus without dysplasia: Secondary | ICD-10-CM | POA: Diagnosis not present

## 2024-09-17 DIAGNOSIS — I10 Essential (primary) hypertension: Secondary | ICD-10-CM | POA: Insufficient documentation

## 2024-09-17 DIAGNOSIS — K219 Gastro-esophageal reflux disease without esophagitis: Secondary | ICD-10-CM | POA: Insufficient documentation

## 2024-09-17 DIAGNOSIS — R1310 Dysphagia, unspecified: Secondary | ICD-10-CM | POA: Insufficient documentation

## 2024-09-17 HISTORY — PX: ESOPHAGEAL DILATION: SHX303

## 2024-09-17 HISTORY — PX: ESOPHAGOGASTRODUODENOSCOPY: SHX5428

## 2024-09-17 MED ORDER — LIDOCAINE 2% (20 MG/ML) 5 ML SYRINGE
INTRAMUSCULAR | Status: DC | PRN
  Administered 2024-09-17: 100 mg via INTRAVENOUS

## 2024-09-17 MED ORDER — LACTATED RINGERS IV SOLN: INTRAVENOUS | Status: DC

## 2024-09-17 MED ORDER — DEXMEDETOMIDINE HCL IN NACL 80 MCG/20ML IV SOLN
INTRAVENOUS | Status: DC | PRN
  Administered 2024-09-17: 8 ug via INTRAVENOUS
  Administered 2024-09-17: 4 ug via INTRAVENOUS

## 2024-09-17 MED ORDER — PROPOFOL 500 MG/50ML IV EMUL
INTRAVENOUS | Status: DC | PRN
  Administered 2024-09-17 (×2): 50 mg via INTRAVENOUS
  Administered 2024-09-17: 150 ug/kg/min via INTRAVENOUS

## 2024-09-17 NOTE — Transfer of Care (Signed)
 Immediate Anesthesia Transfer of Care Note  Patient: Joshua Hodges  Procedure(s) Performed: EGD (ESOPHAGOGASTRODUODENOSCOPY) DILATION, ESOPHAGUS  Patient Location: Short Stay  Anesthesia Type:MAC  Level of Consciousness: drowsy  Airway & Oxygen Therapy: Patient Spontanous Breathing  Post-op Assessment: Report given to RN and Post -op Vital signs reviewed and stable  Post vital signs: Reviewed and stable  Last Vitals:  Vitals Value Taken Time  BP 96/65 09/17/24 10:34  Temp 36.8 C 09/17/24 10:34  Pulse 84 09/17/24 10:34  Resp 22 09/17/24 10:34  SpO2 95 % on RA 09/17/24 10:34    Last Pain:  Vitals:   09/17/24 1034  TempSrc: Oral  PainSc:       Patients Stated Pain Goal: 6 (09/17/24 0929)  Complications: No notable events documented.

## 2024-09-17 NOTE — Op Note (Signed)
 [facility] Patient Name: Joshua Hodges Procedure Date: 09/17/2024 10:03 AM MRN: [id number] Date of Birth: 1951-01-07 Attending MD: Lamar Ozell Hollingshead , MD, [id number] CSN: [id number] Age: 74 Admit Type: Outpatient Procedure:                Upper GI endoscopy Indications:              Dysphagia, Heartburn Providers:                Lamar Ozell Hollingshead, MD, Devere Lodge, Bascom Blush Referring MD:              Medicines:                Propofol  per Anesthesia Complications:            No immediate complications. Estimated Blood Loss:     Estimated blood loss was minimal. Procedure:                Pre-Anesthesia Assessment:                           - Prior to the procedure, a History and Physical                            was performed, and patient medications and                            allergies were reviewed. The patient's tolerance of                            previous anesthesia was also reviewed. The risks                            and benefits of the procedure and the sedation                            options and risks were discussed with the patient.                            All questions were answered, and informed consent                            was obtained. Prior Anticoagulants: The patient has                            taken no anticoagulant or antiplatelet agents. ASA                            Grade Assessment: III - A patient with severe                            systemic disease. After reviewing the risks and                            benefits, the patient was deemed in satisfactory  condition to undergo the procedure.                           After obtaining informed consent, the endoscope was                            passed under direct vision. Throughout the                            procedure, the patient's blood pressure, pulse, and                            oxygen saturations were monitored continuously. The                             [id number] ([id number]) Upper was introduced through                            the mouth, and advanced to the second part of                            duodenum. The upper GI endoscopy was accomplished                            without difficulty. The patient tolerated the                            procedure well. Scope In: 10:17:46 AM Scope Out: 10:27:52 AM Total Procedure Duration: 0 hours 10 minutes 6 seconds  Findings:      Patent throughout its course. No nodularity no esophagitis. However,       salmon-colored epithelium coming is approximately as 33 cm from the       incisors and more confluently down the GE junction ([place] C35/36, max       33); hiatal hernia present37 cm diaphragm 37 cm from incisors. Gastric       mucosa otherwise appeared normal. Patent pylorus.      The duodenal bulb and second portion of the duodenum were normal. Scope       was withdrawn and a 56 French Maloney dilator was passed full insertion       easily. A look back revealed no apparent complication related this       maneuver. No bleeding.      Finally circumferential biopsies at 33 cm, 34 cm and 35 cm taken for       histologic study Impression:               - Abnormal esophagus consistent with nonnodular                            Barrett's esophagusstatus post Maloney dilation                            followed by segmental biopsy. Hiatal hernia. Normal                            duodenal  bulb and second portion of the duodenum. Moderate Sedation:      Moderate (conscious) sedation was personally administered by an       anesthesia professional. The following parameters were monitored: oxygen       saturation, heart rate, blood pressure, respiratory rate, EKG, adequacy       of pulmonary ventilation, and response to care. Recommendation:           - Patient has a contact number available for                            emergencies. The signs and symptoms of  potential                            delayed complications were discussed with the                            patient. Return to normal activities tomorrow.                            Written discharge instructions were provided to the                            patient.                           - Advance diet as tolerated. Continue Protonix  40                            mg daily indefinitely. Use Pepcid only for                            breakthrough reflux symptoms. Follow-up on                            pathology. Office with us  in 6 to 8 weeks. We will                            reassess at that time and possibly a provide                            hemorrhoid banding as previously discussed. Procedure Code(s):        --- Professional ---                           [phone], Esophagogastroduodenoscopy, flexible,                            transoral; diagnostic, including collection of                            specimen(s) by brushing or washing, when performed                            (separate procedure) Diagnosis Code(s):        --- Professional ---  R13.10, Dysphagia, unspecified                           R12, Heartburn CPT copyright 2022 [employer] Association. All rights reserved. The codes documented in this report are preliminary and upon coder review may  be revised to meet current compliance requirements. Lamar HERO. Graeme Menees, MD Lamar Ozell Hollingshead, MD 09/17/2024 10:39:26 AM This report has been signed electronically. Number of Addenda: 0

## 2024-09-17 NOTE — Discharge Instructions (Addendum)
 EGD Discharge instructions Please read the instructions outlined below and refer to this sheet in the next few weeks. These discharge instructions provide you with general information on caring for yourself after you leave the hospital. Your doctor may also give you specific instructions. While your treatment has been planned according to the most current medical practices available, unavoidable complications occasionally occur. If you have any problems or questions after discharge, please call your doctor. ACTIVITY You may resume your regular activity but move at a slower pace for the next 24 hours.  Take frequent rest periods for the next 24 hours.  Walking will help expel (get rid of) the air and reduce the bloated feeling in your abdomen.  No driving for 24 hours (because of the anesthesia (medicine) used during the test).  You may shower.  Do not sign any important legal documents or operate any machinery for 24 hours (because of the anesthesia used during the test).  NUTRITION Drink plenty of fluids.  You may resume your normal diet.  Begin with a light meal and progress to your normal diet.  Avoid alcoholic beverages for 24 hours or as instructed by your caregiver.  MEDICATIONS You may resume your normal medications unless your caregiver tells you otherwise.  WHAT YOU CAN EXPECT TODAY You may experience abdominal discomfort such as a feeling of fullness or gas pains.  FOLLOW-UP Your doctor will discuss the results of your test with you.  SEEK IMMEDIATE MEDICAL ATTENTION IF ANY OF THE FOLLOWING OCCUR: Excessive nausea (feeling sick to your stomach) and/or vomiting.  Severe abdominal pain and distention (swelling).  Trouble swallowing.  Temperature over 101 F (37.8 C).  Rectal bleeding or vomiting of blood.       You have a hiatal hernia.  Your esophagus was stretched today  You have scarring in your esophagus suspicious for Barrett's esophagus-biopsies taken   it is  important to take  Protonix  40 mg 30 minutes for breakfast every day.  May use Pepcid if you have periods of breakthrough reflux symptoms.  Protonix  is important medication to take every day   further recommendations to follow pending review of pathology report   office visit with Therisa Stager in 6 to 8 weeks

## 2024-09-17 NOTE — Interval H&P Note (Signed)
 History and Physical Interval Note:  09/17/2024 10:04 AM  Joshua Hodges  has presented today for surgery, with the diagnosis of dysphagia, gerd.  The various methods of treatment have been discussed with the patient and family. After consideration of risks, benefits and other options for treatment, the patient has consented to  Procedures: EGD (ESOPHAGOGASTRODUODENOSCOPY) (N/A) DILATION, ESOPHAGUS (N/A) as a surgical intervention.  The patient's history has been reviewed, patient examined, Hodges change in status, stable for surgery.  I have reviewed the patient's chart and labs.  Questions were answered to the patient's satisfaction.     Joshua Hodges   patient seen and examined in short stay.  Protonix  has helped his reflux symptoms considerably.  EGD today for diagnostic purposes longstanding reflux potential esophageal dilation for dysphagia. The risks, benefits, limitations, alternatives and imponderables have been reviewed with the patient. Potential for esophageal dilation, biopsy, etc. have also been reviewed.  Questions have been answered. All parties agreeable.

## 2024-09-17 NOTE — Anesthesia Postprocedure Evaluation (Signed)
"   Anesthesia Post Note  Patient: Micheal Murad  Procedure(s) Performed: EGD (ESOPHAGOGASTRODUODENOSCOPY) DILATION, ESOPHAGUS  Patient location during evaluation: Phase II Anesthesia Type: MAC Level of consciousness: awake Pain management: pain level controlled Vital Signs Assessment: post-procedure vital signs reviewed and stable Respiratory status: spontaneous breathing and respiratory function stable Cardiovascular status: blood pressure returned to baseline and stable Postop Assessment: no headache and no apparent nausea or vomiting Anesthetic complications: no Comments: Late entry   No notable events documented.   Last Vitals:  Vitals:   09/17/24 1034 09/17/24 1042  BP: 96/65 112/69  Pulse: 84 98  Resp: (!) 22 17  Temp: 36.8 C   SpO2: 95% 95%    Last Pain:  Vitals:   09/17/24 1042  TempSrc:   PainSc: 0-No pain                 Yvonna PARAS Malaika Arnall      "

## 2024-09-17 NOTE — Anesthesia Preprocedure Evaluation (Signed)
 Anesthesia Evaluation  Patient identified by MRN, date of birth, ID band Patient awake    Reviewed: Allergy & Precautions, H&P , NPO status , Patient's Chart, lab work & pertinent test results, reviewed documented beta blocker date and time   Airway Mallampati: II  TM Distance: >3 FB Neck ROM: full    Dental no notable dental hx.    Pulmonary neg pulmonary ROS   Pulmonary exam normal breath sounds clear to auscultation       Cardiovascular Exercise Tolerance: Good hypertension,  Rhythm:regular Rate:Normal     Neuro/Psych negative neurological ROS  negative psych ROS   GI/Hepatic Neg liver ROS,GERD  ,,  Endo/Other  negative endocrine ROS    Renal/GU negative Renal ROS  negative genitourinary   Musculoskeletal   Abdominal   Peds  Hematology negative hematology ROS (+)   Anesthesia Other Findings   Reproductive/Obstetrics negative OB ROS                              Anesthesia Physical Anesthesia Plan  ASA: 2  Anesthesia Plan: MAC   Post-op Pain Management:    Induction:   PONV Risk Score and Plan: Propofol infusion  Airway Management Planned:   Additional Equipment:   Intra-op Plan:   Post-operative Plan:   Informed Consent: I have reviewed the patients History and Physical, chart, labs and discussed the procedure including the risks, benefits and alternatives for the proposed anesthesia with the patient or authorized representative who has indicated his/her understanding and acceptance.     Dental Advisory Given  Plan Discussed with: CRNA  Anesthesia Plan Comments:         Anesthesia Quick Evaluation

## 2024-09-21 ENCOUNTER — Encounter (HOSPITAL_COMMUNITY): Payer: Self-pay | Admitting: Internal Medicine

## 2024-09-22 LAB — SURGICAL PATHOLOGY

## 2024-09-23 ENCOUNTER — Ambulatory Visit: Payer: Self-pay | Admitting: Internal Medicine

## 2024-11-10 ENCOUNTER — Ambulatory Visit: Admitting: Gastroenterology
